# Patient Record
Sex: Male | Born: 1990 | Race: White | Hispanic: No | Marital: Single | Smoking: Current some day smoker
Health system: Southern US, Community
[De-identification: ages and names within clinical notes are randomized; demographics above are authoritative.]

## PROBLEM LIST (undated history)

## (undated) DIAGNOSIS — S83519A Sprain of anterior cruciate ligament of unspecified knee, initial encounter: Secondary | ICD-10-CM

## (undated) DIAGNOSIS — F909 Attention-deficit hyperactivity disorder, unspecified type: Secondary | ICD-10-CM

---

## 2018-10-23 ENCOUNTER — Emergency Department (HOSPITAL_COMMUNITY)
Admission: EM | Admit: 2018-10-23 | Discharge: 2018-10-24 | Disposition: A | Discharge status: Home / Self Care | Payer: BC Managed Care – PPO | Attending: Emergency Medicine | Admitting: Emergency Medicine

## 2018-10-23 ENCOUNTER — Encounter (HOSPITAL_COMMUNITY): Payer: Self-pay | Admitting: Emergency Medicine

## 2018-10-23 ENCOUNTER — Other Ambulatory Visit: Payer: Self-pay

## 2018-10-23 DIAGNOSIS — L03115 Cellulitis of right lower limb: Secondary | ICD-10-CM | POA: Diagnosis not present

## 2018-10-23 DIAGNOSIS — F1210 Cannabis abuse, uncomplicated: Secondary | ICD-10-CM | POA: Insufficient documentation

## 2018-10-23 DIAGNOSIS — F17200 Nicotine dependence, unspecified, uncomplicated: Secondary | ICD-10-CM | POA: Diagnosis not present

## 2018-10-23 DIAGNOSIS — L0291 Cutaneous abscess, unspecified: Secondary | ICD-10-CM | POA: Diagnosis present

## 2018-10-23 DIAGNOSIS — L02611 Cutaneous abscess of right foot: Secondary | ICD-10-CM | POA: Diagnosis not present

## 2018-10-23 DIAGNOSIS — R2241 Localized swelling, mass and lump, right lower limb: Secondary | ICD-10-CM | POA: Insufficient documentation

## 2018-10-23 NOTE — ED Notes (Signed)
Bed: WA06 Expected date:  Expected time:  Means of arrival:  Comments: 

## 2018-10-23 NOTE — ED Triage Notes (Signed)
Patient states he woken up by a spider bite in his bedroom. Patient has an abscess on the right inner foot. Patient has redness and swelling.

## 2018-10-24 LAB — CBC WITH DIFFERENTIAL/PLATELET
Abs Immature Granulocytes: 0 10*3/uL (ref 0.00–0.07)
Basophils Absolute: 0 10*3/uL (ref 0.0–0.1)
Basophils Relative: 1 %
Eosinophils Absolute: 0.1 10*3/uL (ref 0.0–0.5)
Eosinophils Relative: 1 %
HCT: 39 % (ref 39.0–52.0)
Hemoglobin: 12.6 g/dL — ABNORMAL LOW (ref 13.0–17.0)
Immature Granulocytes: 0 %
Lymphocytes Relative: 26 %
Lymphs Abs: 1.4 10*3/uL (ref 0.7–4.0)
MCH: 27.4 pg (ref 26.0–34.0)
MCHC: 32.3 g/dL (ref 30.0–36.0)
MCV: 84.8 fL (ref 80.0–100.0)
Monocytes Absolute: 0.5 10*3/uL (ref 0.1–1.0)
Monocytes Relative: 10 %
Neutro Abs: 3.2 10*3/uL (ref 1.7–7.7)
Neutrophils Relative %: 62 %
Platelets: 246 10*3/uL (ref 150–400)
RBC: 4.6 MIL/uL (ref 4.22–5.81)
RDW: 13.1 % (ref 11.5–15.5)
WBC: 5.2 10*3/uL (ref 4.0–10.5)
nRBC: 0 % (ref 0.0–0.2)

## 2018-10-24 LAB — BASIC METABOLIC PANEL
Anion gap: 13 (ref 5–15)
BUN: 9 mg/dL (ref 6–20)
CO2: 22 mmol/L (ref 22–32)
Calcium: 9.2 mg/dL (ref 8.9–10.3)
Chloride: 106 mmol/L (ref 98–111)
Creatinine, Ser: 0.81 mg/dL (ref 0.61–1.24)
GFR calc Af Amer: >60 mL/min (ref >60)
GFR calc non Af Amer: >60 mL/min (ref >60)
Glucose, Bld: 101 mg/dL — ABNORMAL HIGH (ref 70–99)
Potassium: 3.9 mmol/L (ref 3.5–5.1)
Sodium: 141 mmol/L (ref 135–145)

## 2018-10-24 MED ORDER — CLINDAMYCIN HCL 150 MG PO CAPS: 300.0000 mg | ORAL_CAPSULE | Freq: Four times a day (QID) | ORAL | 0 refills | Status: DC

## 2018-10-24 MED ORDER — CLINDAMYCIN HCL 300 MG PO CAPS
300.0000 mg | ORAL_CAPSULE | Freq: Once | ORAL | Status: AC
  Administered 2018-10-24: 300 mg via ORAL
  Filled 2018-10-24: qty 1

## 2018-10-24 MED ORDER — LIDOCAINE-EPINEPHRINE (PF) 2 %-1:200000 IJ SOLN
10.0000 mL | Freq: Once | INTRAMUSCULAR | Status: AC
  Administered 2018-10-24: 10 mL
  Filled 2018-10-24: qty 10

## 2018-10-24 NOTE — Discharge Instructions (Addendum)
Please read and follow all provided instructions.  Your diagnoses today include:  1. Abscess of right foot   2. Cellulitis of right lower leg     Tests performed today include:  Vital signs. See below for your results today.   Medications prescribed:   Clindamycin - antibiotic  You have been prescribed an antibiotic medicine: take the entire course of medicine even if you are feeling better. Stopping early can cause the antibiotic not to work.  Take any prescribed medications only as directed.   Home care instructions:   Follow any educational materials contained in this packet  Follow-up instructions: Return to the Emergency Department in 48 hours for a recheck if you cannot see a primary care physician.   Return instructions:  Return to the Emergency Department if you have:  Fever  Worsening symptoms  Worsening pain  Worsening swelling  Redness of the skin that moves away from the affected area, especially if it streaks away from the affected area   Any other emergent concerns  Your vital signs today were: BP 121/77 (BP Location: Left Arm)    Pulse (!) 110    Temp 98.8 F (37.1 C) (Oral)    Resp 20    Ht 5\' 11"  (1.803 m)    Wt 70.3 kg    SpO2 98%    BMI 21.62 kg/m  If your blood pressure (BP) was elevated above 135/85 this visit, please have this repeated by your doctor within one month. --------------

## 2018-10-24 NOTE — ED Provider Notes (Addendum)
[facility]-EMERGENCY DEPT Provider Note   CSN: [id number] Arrival date & time: 10/23/18  2235     History   Chief Complaint Chief Complaint  Patient presents with  . Abscess    HPI Douglas Hawkins is a 28 y.o. male.     Patient presents to the emergency department with complaint of right foot abscess, redness, warmth and swelling starting acutely 2 to 3 days ago.  Patient states that he woke up the other morning with a spider on his leg.  He was able to kill it.  He then developed a small area of swelling and pain on the inside of the foot.  This has progressed.  Yesterday he drained the area of swelling at home.  Today the redness spread up towards his knee.  No reported fevers, nausea or vomiting.  No history of immunocompromise.  Patient has not had similar skin infections in the past.     History reviewed. No pertinent past medical history.  There are no active problems to display for this patient.   History reviewed. No pertinent surgical history.      Home Medications    Prior to Admission medications   Not on File    Family History History reviewed. No pertinent family history.  Social History Social History   Tobacco Use  . Smoking status: Current Every Day Smoker  . Smokeless tobacco: Never Used  Substance Use Topics  . Alcohol use: Yes  . Drug use: Yes    Types: Marijuana     Allergies   Patient has no known allergies.   Review of Systems Review of Systems  Constitutional: Negative for fever.  Gastrointestinal: Negative for nausea and vomiting.  Musculoskeletal: Positive for myalgias.  Skin: Positive for color change and wound.       Positive for abscess  Hematological: Negative for adenopathy.     Physical Exam Updated Vital Signs BP 121/77 (BP Location: Left Arm)   Pulse (!) 110   Temp 98.8 F (37.1 C) (Oral)   Resp 20   Ht 5\' 11"  (1.803 m)   Wt 70.3 kg   SpO2 98%   BMI 21.62 kg/m    Physical Exam Vitals signs and nursing note reviewed.  Constitutional:      Appearance: He is well-developed.  HENT:     Head: Normocephalic and atraumatic.  Eyes:     Conjunctiva/sclera: Conjunctivae normal.  Neck:     Musculoskeletal: Normal range of motion and neck supple.  Cardiovascular:     Pulses:          Dorsalis pedis pulses are 2+ on the right side.       Posterior tibial pulses are 2+ on the right side.  Pulmonary:     Effort: No respiratory distress.  Musculoskeletal:       Feet:  Skin:    General: Skin is warm and dry.  Neurological:     Mental Status: He is alert.      ED Treatments / Results  Labs (all labs ordered are listed, but only abnormal results are displayed) Labs Reviewed  CBC WITH DIFFERENTIAL/PLATELET - Abnormal; Notable for the following components:      Result Value   Hemoglobin 12.6 (*)    All other components within normal limits  BASIC METABOLIC PANEL - Abnormal; Notable for the following components:   Glucose, Bld 101 (*)    All other components within normal limits    EKG  Radiology No results found.  Procedures .Marland KitchenIncision and Drainage  Date/Time: 10/24/2018 12:52 AM Performed by: Carlisle Cater, PA-C Authorized by: Carlisle Cater, PA-C   Consent:    Consent obtained:  Verbal   Consent given by:  Patient   Risks discussed:  Bleeding, incomplete drainage, pain and infection   Alternatives discussed:  No treatment Location:    Type:  Abscess   Size:  3cm   Location:  Lower extremity   Lower extremity location:  Ankle   Ankle location:  R ankle Pre-procedure details:    Skin preparation:  Betadine Anesthesia (see MAR for exact dosages):    Anesthesia method:  Local infiltration   Local anesthetic:  Lidocaine 2% WITH epi Procedure type:    Complexity:  Simple Procedure details:    Needle aspiration: no     Incision types:  Stab incision   Scalpel blade:  11   Wound management:  Probed and deloculated    Drainage:  Purulent   Drainage amount:  Scant   Wound treatment:  Wound left open   Packing materials:  None Post-procedure details:    Patient tolerance of procedure:  Tolerated well, no immediate complications   (including critical care time)  Medications Ordered in ED Medications  clindamycin (CLEOCIN) capsule 300 mg (300 mg Oral Given 10/24/18 0027)  lidocaine-EPINEPHrine (XYLOCAINE W/EPI) 2 %-1:200000 (PF) injection 10 mL (10 mLs Infiltration Given by Other 10/24/18 0031)     Initial Impression / Assessment and Plan / ED Course  I have reviewed the triage vital signs and the nursing notes.  Pertinent labs & imaging results that were available during my care of the patient were reviewed by me and considered in my medical decision making (see chart for details).        Patient seen and examined.  Will proceed with incision and drainage to fully open up abscess pocket.  Patient will be started on antibiotics.  Offered IV antibiotics and patient declines.  Will give 300 mg p.o. clindamycin.  Will check basic lab work as well given extent of cellulitis.  Patient appears well, nontoxic.  Afebrile on arrival.  Mild associated tenderness, no severe tenderness as would be expected for necrotizing fasciitis.  Mild tachycardia, will recheck.  No history of diabetes, other comorbidities, or immunocompromise.  Suspect patient will be able to be discharged with close follow-up, return if worsening.   Vital signs reviewed and are as follows: BP 121/77 (BP Location: Left Arm)   Pulse (!) 110   Temp 98.8 F (37.1 C) (Oral)   Resp 20   Ht 5\' 11"  (1.803 m)   Wt 70.3 kg   SpO2 98%   BMI 21.62 kg/m   12:51 AM I&D performed as above. WBC is normal. Pending BMP and vitals recheck.   The patient was urged to return to the Emergency Department urgently with worsening pain, swelling, expanding erythema especially if it streaks away from the affected area, fever, or if they have any other concerns.    The patient verbalized understanding and stated agreement with this plan.   1:08 AM Pt updated. Tachycardia improved.   BP 106/64 (BP Location: Right Arm)   Pulse 90   Temp 98.8 F (37.1 C) (Oral)   Resp 18   Ht 5\' 11"  (1.803 m)   Wt 70.3 kg   SpO2 100%   BMI 21.62 kg/m     Final Clinical Impressions(s) / ED Diagnoses   Final diagnoses:  Abscess of right foot  Cellulitis of right lower leg   Patient, well-appearing, no comorbidities or immunocompromise, normal white blood cell count.  No pain on proportion as would be expected for necrotizing fasciitis.  Patient with extensive cellulitis but appears clinically well.  I do not feel he requires hospitalization at this time.  We discussed strict return instructions as above.  Discussed need for recheck in 24 to 48 hours.  Patient seems reliable to return with worsening or changing symptoms.  ED Discharge Orders         Ordered    clindamycin (CLEOCIN) 150 MG capsule  Every 6 hours     10/24/18 0051              Renne CriglerGeiple, Adnan Vanvoorhis, PA-C 10/24/18 0108    Zadie RhineWickline, Donald, MD 10/24/18 [phone]

## 2019-05-24 ENCOUNTER — Ambulatory Visit (INDEPENDENT_AMBULATORY_CARE_PROVIDER_SITE_OTHER): Payer: BC Managed Care – PPO | Admitting: Psychiatry

## 2019-05-24 ENCOUNTER — Encounter: Payer: Self-pay | Admitting: Psychiatry

## 2019-05-24 VITALS — Ht 71.0 in | Wt 195.0 lb

## 2019-05-24 DIAGNOSIS — F902 Attention-deficit hyperactivity disorder, combined type: Secondary | ICD-10-CM

## 2019-05-24 DIAGNOSIS — F4010 Social phobia, unspecified: Secondary | ICD-10-CM | POA: Diagnosis not present

## 2019-05-24 DIAGNOSIS — F331 Major depressive disorder, recurrent, moderate: Secondary | ICD-10-CM | POA: Diagnosis not present

## 2019-05-24 MED ORDER — AMPHETAMINE-DEXTROAMPHETAMINE 10 MG PO TABS: 10.0000 mg | ORAL_TABLET | Freq: Every day | ORAL | 0 refills | Status: DC

## 2019-05-24 MED ORDER — AMPHETAMINE-DEXTROAMPHET ER 25 MG PO CP24: 25.0000 mg | ORAL_CAPSULE | Freq: Two times a day (BID) | ORAL | 0 refills | Status: DC

## 2019-05-24 MED ORDER — CLONAZEPAM 0.5 MG PO TABS: 0.5000 mg | ORAL_TABLET | Freq: Two times a day (BID) | ORAL | 0 refills | Status: DC | PRN

## 2019-05-24 MED ORDER — SERTRALINE HCL 100 MG PO TABS: ORAL_TABLET | ORAL | 1 refills | Status: DC

## 2019-05-24 NOTE — Progress Notes (Signed)
Virtual Visit via Video Note  I connected with Douglas Hawkins on 05/24/19 at 10:30 AM EST by a video enabled telemedicine application and verified that I am speaking with the correct person using two identifiers.  Location: Patient: Home Provider: Home   I discussed the limitations of evaluation and management by telemedicine and the availability of in person appointments. The patient expressed understanding and agreed to proceed.  I discussed the assessment and treatment plan with the patient. The patient was provided an opportunity to ask questions and all were answered. The patient agreed with the plan and demonstrated an understanding of the instructions.   The patient was advised to call back or seek an in-person evaluation if the symptoms worsen or if the condition fails to improve as anticipated.  I provided 70 minutes of non-face-to-face time during this encounter.   Corie Chiquito, PMHNP   [facility] MD/PA/NP Initial Note  05/24/2019 5:44 PM Douglas Hawkins  MRN:  [id number]  Chief Complaint:  Chief Complaint    Establish Care; Anxiety; Depression; ADD      HPI: Patient is a 29 year old male being seen for initial evaluation to establish care for management of anxiety, depression, and attention deficit after moving from [place] in 2019.  Patient reports that he was continuing to travel to [place] every 3 months to see his psychiatrist there until Dr. Clelia Croft until had to close his practice.  Patient reports that this coincided with the start of the pandemic, which contributed to some delays with him establishing care with a new provider.   He reports that he had been stable on Sertraline 150 mg po qd, Adderall XR 25 mg po BID, Adderall 10 mg po BID, and Klonopin 0.5 mg po BID to TID prn anxiety.  He reports that he was not able to continue medications due to not having a psychiatric provider and that PCP restarted sertraline at 50 mg about 2 weeks ago.  He  first sought psychiatric tx around age 67 or 89 in 2011-2012. He reports that he was dx'd with ADHD, depression, and social anxiety at that time. He first noticed some social anxiety in elementary school. He reports that he has difficulty starting conversations and anxiety in social situations. Reports feeling as if others are scrutinizing him at times when he know that this is not accurate. Will have increased HR and BP and feel entrapped. Will feel "like I am seizing up" with muscle tightness. He reports that he has had a couple of panic attacks, but this is not typical for him and has only occurred in the context of severe stressors. He denies generalized anxiety. He reports feeling self-conscious. Occ anxiety with driving. Occ checking behaviors. He reports at times wanting to make sure he is not seen.  He reports that his mood has been better with idea of re-starting medications. He reports some depression in response to moving right before the pandemic and not being able to establish a friend's group, and therefore feeling more isolated. He reports, "I haven't been alarmed by mental health," and that he has had more severe signs and symptoms in the past.  He reports that he has been sleeping ok and sleeps about 7-8 hours a night. Appetite has been good. Has gained a few pounds with covid. He reports that energy and motivation have been low. He reports that this has interfered some with personal hygiene and "seemingly arbitrary things." Denies anhedonia. Denies any current or past SI.    He  reports that in the past depressive episodes he has severe negative thinking and has some apathy and occ oversleeping. He reports that he becomes disinterested in learning new things.  He reports that he typically has some mild depressive s/s that periodically flare.   Denies any past manic s/s.   Denies AH or VH. Denies paranoia.  Has been dx'd with ADHD. He reports difficulty prioritizing tasks and completing  tasks. He reports that he tends to procrastinating things. Frequently loses and misplaces things. Easily distracted. Difficulty siting still and will get antsy.    Born in [place] and lived there until age 4-4. Moved frequently in childhood.Has an older sister that is 19 and is a Garment/textile technologist. He reports that he had a good childhood and parents are supportive.  Lived in [place] and moved to [place] in 2013. Has had some community college. Had a job interview yesterday and plans to work in [employer]. Moved to [place] in 2019. Lives with his parents. Father wanted to downsize on their home and mother is from [place] and they have family in the area. Parents and sister are all supportive. He enjoys reading, music, video games, and target/ skeet shooting. Describes himself as introverted. Currently not in a relationship.  Past Psychiatric Medication Trials: Sertraline- re-started 2 weeks ago at 50 mg po qd by PCP. Took in HS. He reports that he has taken up to 200 mg po qd in the past. He was taking around 150 mg prior to stopping mediations. Denies side effects other than possible affective dulling. Prozac Effexor Buspar- Took briefly and had side effects. Ritalin- "felt like a robot." Adderall XR- Took 25 mg  Adderall- Has taken prn  Vyvanse- Had insomnia and was not able to wind down at the end of the day. Klonopin- Took 0.5 mg po TID prn anxiety   Visit Diagnosis:    ICD-10-CM   1. Attention deficit hyperactivity disorder (ADHD), combined type  F90.2 amphetamine-dextroamphetamine (ADDERALL XR) 25 MG 24 hr capsule    amphetamine-dextroamphetamine (ADDERALL) 10 MG tablet  2. Social anxiety disorder  F40.10 sertraline (ZOLOFT) 100 MG tablet    clonazePAM (KLONOPIN) 0.5 MG tablet  3. Moderate episode of recurrent major depressive disorder (HCC)  F33.1 sertraline (ZOLOFT) 100 MG tablet    Past Psychiatric History: h/o psychiatric hospitalization in 2014 or 2015. Was seeing Dr. Clelia Croft in  [place]. Saw a therapist in [place] in his teens.   Past Medical History: No past medical history on file. No past surgical history on file.  Family Psychiatric History: Suspects that his mother has undiagnosed ADD. Mother's cousin committed suicide and may have had Bipolar D/O.   Family History:  Family History  Problem Relation Age of Onset  . ADD / ADHD Mother   . Hypertension Father   . Diabetes Father   . Diabetes Paternal Grandfather   . Suicidality Cousin   . Bipolar disorder Cousin     Social History:  Social History   Socioeconomic History  . Marital status: Single    Spouse name: Not on file  . Number of children: Not on file  . Years of education: Not on file  . Highest education level: Not on file  Occupational History  . Not on file  Tobacco Use  . Smoking status: Current Some Day Smoker  . Smokeless tobacco: Never Used  Substance and Sexual Activity  . Alcohol use: Yes    Comment: 2-3 drinks daily  . Drug use: Not Currently  .  Sexual activity: Not on file  Other Topics Concern  . Not on file  Social History Narrative  . Not on file   Social Determinants of Health   Financial Resource Strain:   . Difficulty of Paying Living Expenses: Not on file  Food Insecurity:   . Worried About Programme researcher, broadcasting/film/video in the Last Year: Not on file  . Ran Out of Food in the Last Year: Not on file  Transportation Needs:   . Lack of Transportation (Medical): Not on file  . Lack of Transportation (Non-Medical): Not on file  Physical Activity:   . Days of Exercise per Week: Not on file  . Minutes of Exercise per Session: Not on file  Stress:   . Feeling of Stress : Not on file  Social Connections:   . Frequency of Communication with Friends and Family: Not on file  . Frequency of Social Gatherings with Friends and Family: Not on file  . Attends Religious Services: Not on file  . Active Member of Clubs or Organizations: Not on file  . Attends Banker  Meetings: Not on file  . Marital Status: Not on file    Allergies: No Known Allergies  Metabolic Disorder Labs: No results found for: HGBA1C, MPG No results found for: PROLACTIN No results found for: CHOL, TRIG, HDL, CHOLHDL, VLDL, LDLCALC No results found for: TSH  Therapeutic Level Labs: No results found for: LITHIUM No results found for: VALPROATE No components found for:  CBMZ  Current Medications: Current Outpatient Medications  Medication Sig Dispense Refill  . Multiple Vitamin (MULTIVITAMIN) capsule Take 1 capsule by mouth daily.    . sertraline (ZOLOFT) 100 MG tablet Take 1 tab po qd for 1 week, then increase to 1.5 tabs po qd 45 tablet 1  . amphetamine-dextroamphetamine (ADDERALL XR) 25 MG 24 hr capsule Take 1 capsule by mouth 2 (two) times daily. 60 capsule 0  . amphetamine-dextroamphetamine (ADDERALL) 10 MG tablet Take 1 tablet (10 mg total) by mouth daily with breakfast. 30 tablet 0  . clonazePAM (KLONOPIN) 0.5 MG tablet Take 1 tablet (0.5 mg total) by mouth 2 (two) times daily as needed for anxiety. 60 tablet 0   No current facility-administered medications for this visit.    Medication Side Effects: none  Orders placed this visit:  No orders of the defined types were placed in this encounter.   Psychiatric Specialty Exam:  Review of Systems  Constitutional: Negative.   HENT: Negative.   Eyes: Negative.   Respiratory: Negative.   Endocrine: Negative.   Genitourinary: Negative.   Allergic/Immunologic: Negative.   Neurological: Negative.   Psychiatric/Behavioral:       Please refer to HPI    Height 5\' 11"  (1.803 m), weight 195 lb (88.5 kg).Body mass index is 27.2 kg/m.  General Appearance: Casual and Neat  Eye Contact:  Good  Speech:  Clear and Coherent and Normal Rate  Volume:  Normal  Mood:  Anxious and Depressed  Affect:  Congruent and Depressed  Thought Process:  Coherent, Linear and Descriptions of Associations: Intact  Orientation:  Full  (Time, Place, and Person)  Thought Content: Logical and Hallucinations: None   Suicidal Thoughts:  No  Homicidal Thoughts:  No  Memory:  WNL  Judgement:  Intact  Insight:  Good  Psychomotor Activity:  Normal  Concentration:  Concentration: Good and Attention Span: Good  Recall:  Good  Fund of Knowledge: Good  Language: Good  Assets:  Communication Skills Desire for  Improvement Resilience Social Support  ADL's:  Intact  Cognition: WNL  Prognosis:  Good   Receiving Psychotherapy: No   Treatment Plan/Recommendations: Patient seen for 70 minutes and at counseling patient and coordination of care, to include process to obtain records from previous psychiatric provider.  Patient reports that he will look for contact information and forms given to him by previous psychiatric provider.  Discussed resuming previous treatment regimen since patient reports that this was helpful in the past and well-tolerated.  Discussed continuing to increase sertraline to 150 mg daily since patient was previously on 150 to 200 mg daily, and these are the doses typically required for adequate control of social anxiety signs and symptoms.  Will increase sertraline to 100 mg daily for 1 week, then increase to 150 mg daily for anxiety and depression.  Will restart Adderall XR 25 mg twice daily for attention deficit disorder.  Discussed that Adderall 10 mg once daily as needed would be prescribed for ADHD.  Will start Klonopin 0.5 mg twice daily as needed for anxiety.  Patient reports that he is amenable to starting psychotherapy.  Patient referred to Sammuel Cooper, LCSW.  Follow-up with this provider in 4 weeks or sooner if clinically indicated. Patient advised to contact office with any questions, adverse effects, or acute worsening in signs and symptoms.    Thayer Headings, PMHNP

## 2019-05-30 ENCOUNTER — Ambulatory Visit: Payer: BC Managed Care – PPO | Admitting: Addiction (Substance Use Disorder)

## 2019-05-31 ENCOUNTER — Telehealth: Payer: Self-pay | Admitting: Psychiatry

## 2019-05-31 ENCOUNTER — Ambulatory Visit: Payer: BC Managed Care – PPO | Attending: Internal Medicine

## 2019-05-31 DIAGNOSIS — Z20822 Contact with and (suspected) exposure to covid-19: Secondary | ICD-10-CM

## 2019-06-01 ENCOUNTER — Telehealth: Payer: Self-pay

## 2019-06-01 LAB — NOVEL CORONAVIRUS, NAA: SARS-CoV-2, NAA: NOT DETECTED

## 2019-06-01 NOTE — Telephone Encounter (Signed)
Prior authorization submitted for Amphetamine-Dextroamphetamine XR 25 mg 1 bid, and Amphetamine-Dextroamphetamine 10 mg 1 daily.  Both approved through [facility] [employer] [id number], [facility] effective 05/31/2019-05/30/2022   Submitted through cover my meds

## 2019-06-07 NOTE — Telephone Encounter (Signed)
Error

## 2019-06-09 ENCOUNTER — Telehealth: Payer: Self-pay | Admitting: Psychiatry

## 2019-06-09 NOTE — Telephone Encounter (Signed)
Douglas Hawkins called to report that the pharmacy, [facility] on [facility] [address], said they still can't fill the Adderall 10mg  because they said the medication approved is not the same as the medication prescribed.  One was a sulfate and the other was a salt combo so they need a PA for the medication prescribed or a prescription for the one that was approved.  Please call the pharmacy to straighten this out so he can get his medication.  He got the Adderall XR 25mg  just fine.

## 2019-06-10 NOTE — Telephone Encounter (Signed)
Called patient's pharmacy left voicemail the Adderall 10 mg (amphetamine-dextroamphetamine 10 mg) has been approved as well effective 06/09/2019-06/08/2022.

## 2019-06-21 ENCOUNTER — Ambulatory Visit: Payer: BC Managed Care – PPO | Admitting: Psychiatry

## 2019-06-24 ENCOUNTER — Ambulatory Visit: Payer: BC Managed Care – PPO | Admitting: Psychiatry

## 2021-04-04 ENCOUNTER — Other Ambulatory Visit: Payer: Self-pay | Admitting: Adult Health

## 2021-04-04 DIAGNOSIS — M25851 Other specified joint disorders, right hip: Secondary | ICD-10-CM

## 2021-04-04 DIAGNOSIS — M25852 Other specified joint disorders, left hip: Secondary | ICD-10-CM

## 2021-05-07 ENCOUNTER — Ambulatory Visit
Admission: RE | Admit: 2021-05-07 | Discharge: 2021-05-07 | Disposition: A | Discharge status: Home / Self Care | Payer: Self-pay | Source: Ambulatory Visit | Attending: Adult Health | Admitting: Adult Health

## 2021-05-07 ENCOUNTER — Other Ambulatory Visit: Payer: Self-pay

## 2021-05-07 DIAGNOSIS — M25852 Other specified joint disorders, left hip: Secondary | ICD-10-CM

## 2021-05-07 DIAGNOSIS — M25851 Other specified joint disorders, right hip: Secondary | ICD-10-CM

## 2021-05-07 IMAGING — MR MR HIP*R* W/O CM
5 series · 34 of 40 positions shown · non-contrast
Comparison: None available

CLINICAL DATA: Bilateral hip injury [6W]. Chronic bilateral hip
pain with loss of range of motion.

EXAM:
MR OF THE BILATERAL HIPS WITHOUT CONTRAST
TECHNIQUE: Multiplanar, multisequence MR imaging was performed. No intravenous
contrast was administered.

[Series 9: T2 fat-sat · coronal · right · 3.0mm · 0.89mm/px · 8 of 30 slices shown (1 of 2)]
[im 1/30]
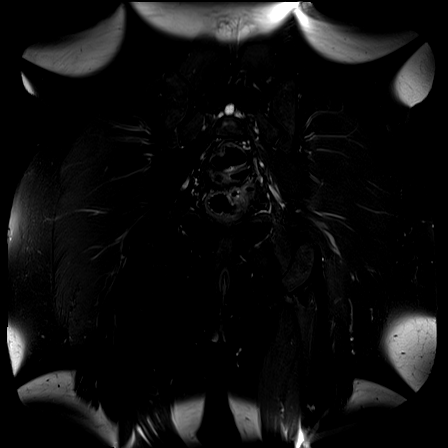
[im 5/30]
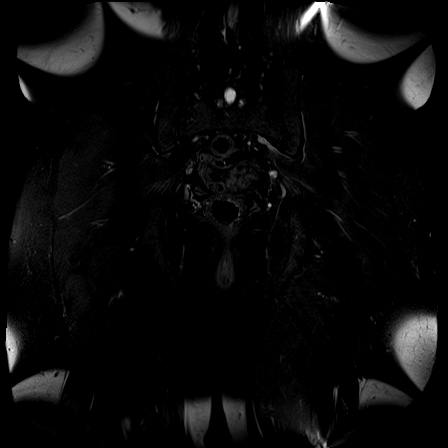
[im 9/30]
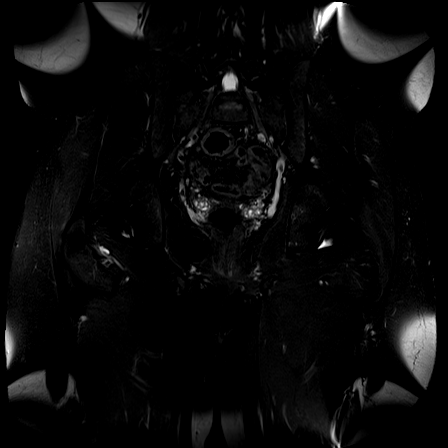
[im 13/30]
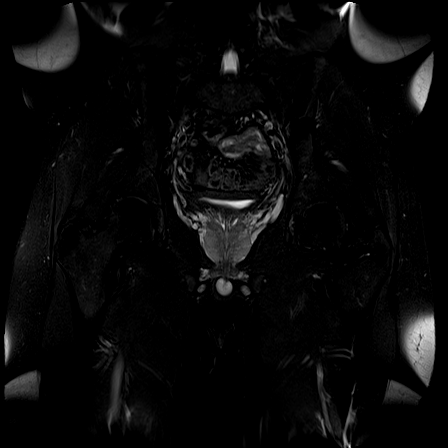
[im 17/30]
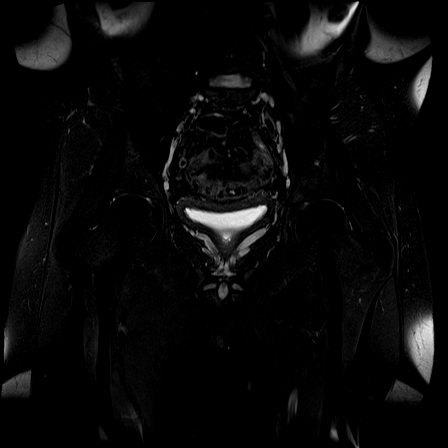
[im 21/30]
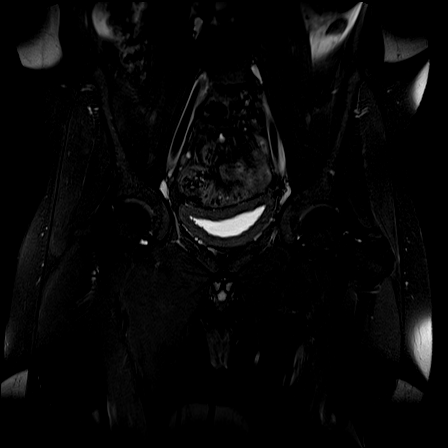
[im 25/30]
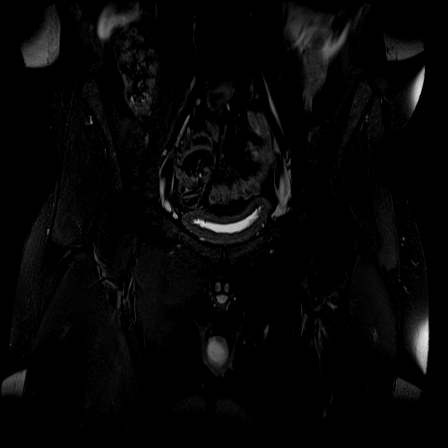
[im 30/30]
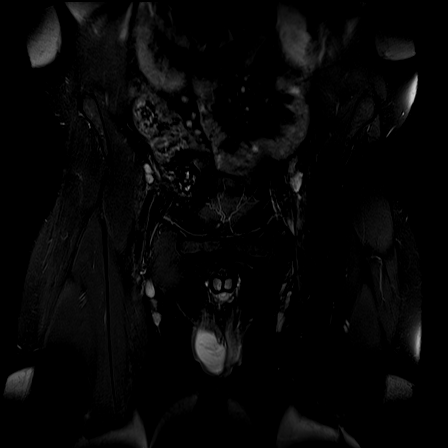

[Series 10: T1 · coronal · right · 3.0mm · 0.89mm/px · 2 of 30 slices shown]
[im 1/30]
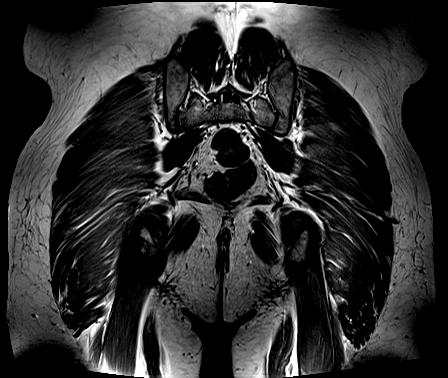
[im 5/30]
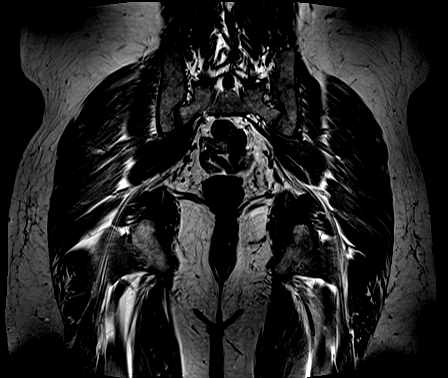

[Series 11: T2 fat-sat · axial · right · 3.0mm · 1.19mm/px · z∈[-72,+39]mm · 8 of 32 slices shown (2 of 2)]
[im 1/32]
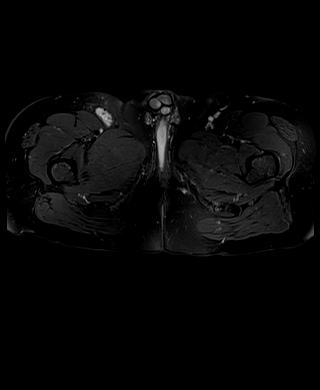
[im 5/32]
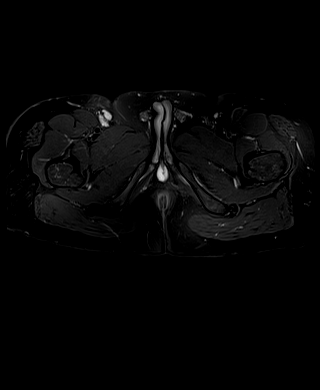
[im 9/32]
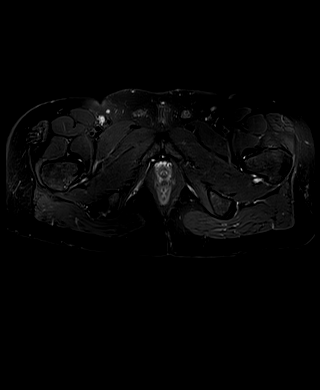
[im 14/32]
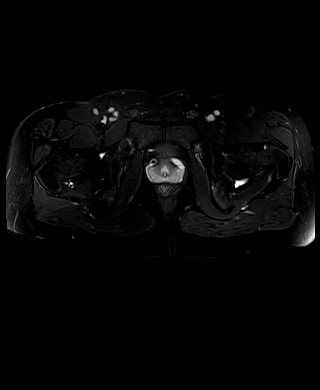
[im 18/32]
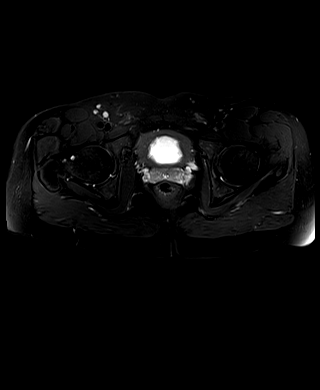
[im 23/32]
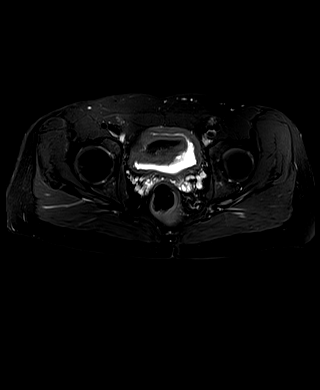
[im 27/32]
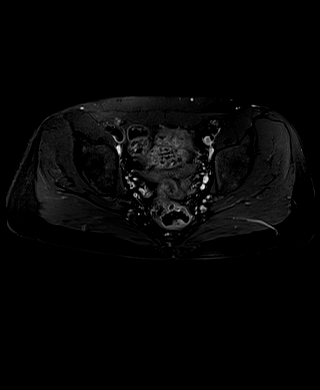
[im 32/32]
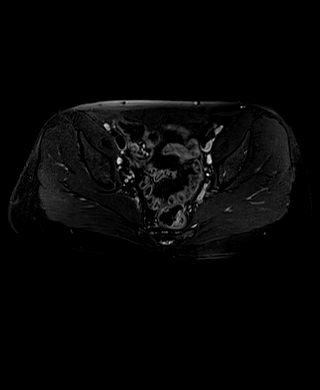

[Series 13: PD fat-sat · sagittal · right · 3.0mm · 0.56mm/px · 9 of 35 slices shown (1 of 2)]
[im 1/35]
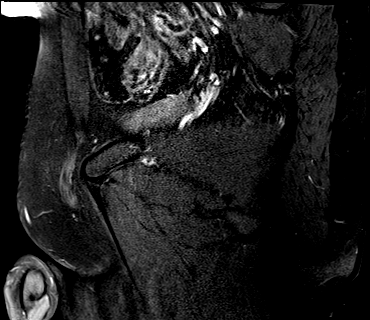
[im 5/35]
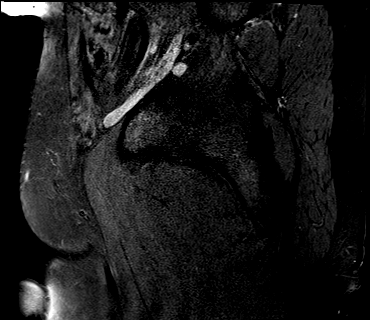
[im 9/35]
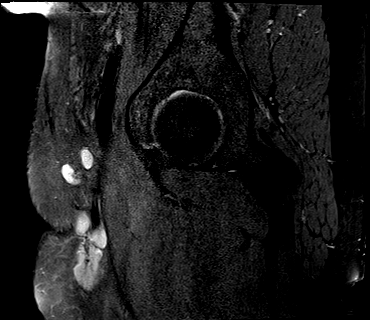
[im 13/35]
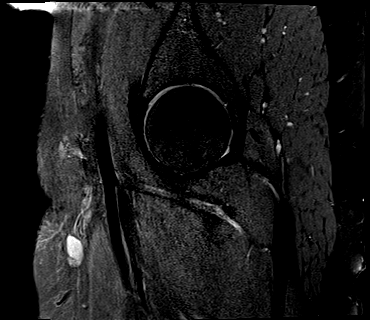
[im 18/35]
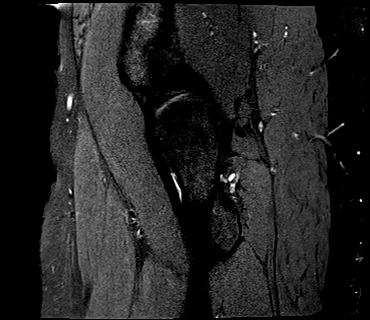
[im 22/35]
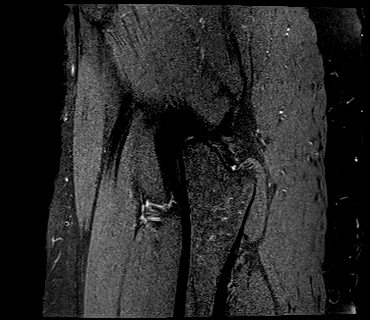
[im 26/35]
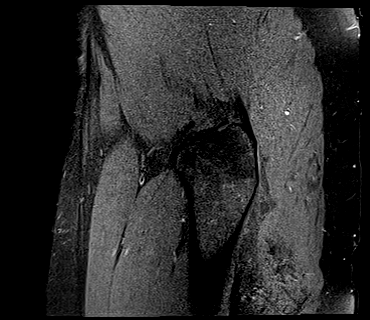
[im 30/35]
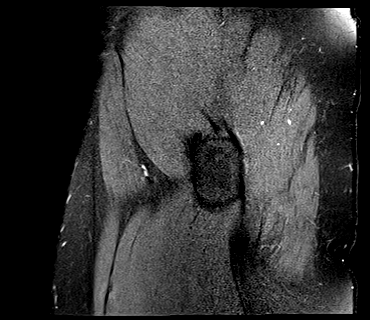
[im 35/35]
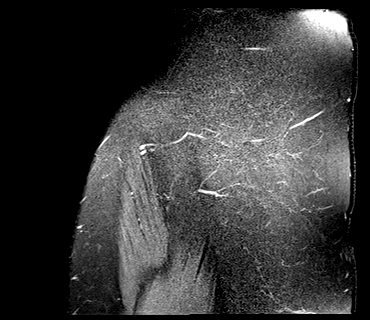

[Series 14: PD fat-sat · coronal · right · 3.0mm · 0.70mm/px · 7 of 28 slices shown (2 of 2)]
[im 1/28]
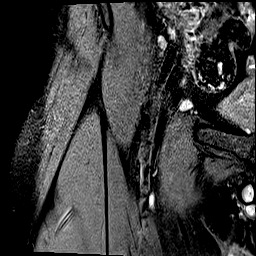
[im 5/28]
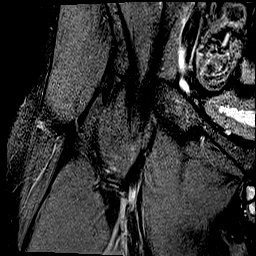
[im 10/28]
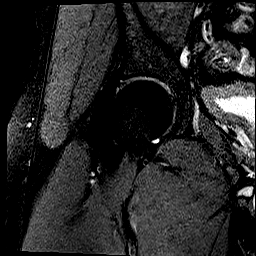
[im 14/28]
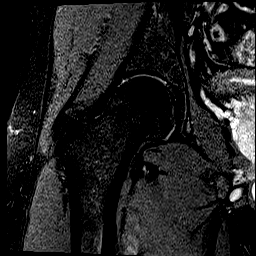
[im 19/28]
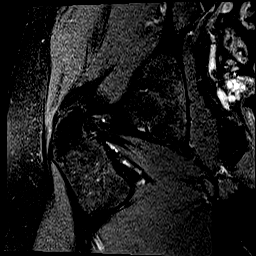
[im 23/28]
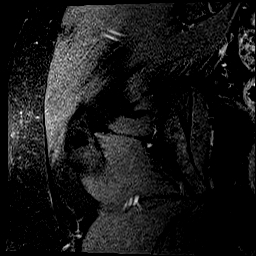
[im 28/28]
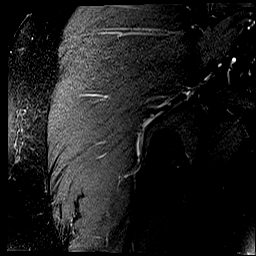

[34 of 40 positions shown; findings below may reference images not displayed]

FINDINGS: Bones: No acute fracture or avascular necrosis is seen within the
visualized portion of the pelvis or either proximal femur. Mild
pubic symphysis joint space narrowing and peripheral osteophytosis
degenerative change.

Articular cartilage and labrum

Right hip:

Articular cartilage: Intact cartilage. There is minimal convexity of
the anterior superior right femoral head-neck junction, mild CAM
type deformity. In this region there is a 6 mm subcortical cyst
suggesting the sequela of impingement.

Labrum: Lack of intra-articular fluid limits evaluation of the right
acetabular labrum. Within this limitation, there is increased fluid
sensitive linear signal within the superior right chondrolabral
junction extending from the anterior superior 11 o'clock location
through the posterosuperior 1 o'clock location. A portion of the
posterior aspect of this increased signal extends into what may be a
normal variant posterosuperior sublabral sulcus.

Left hip:

Articular cartilage: Intact cartilage. There is a minimal convexity
of the anterior superior left femoral head-neck junction, mild CAM
type bump deformity.

Labrum: Lack of intra-articular fluid limits evaluation of the left
acetabular labrum. There is linear increased proton density signal
within the superior left chondrolabral junction (left hip coronal
series 15 images 10 through 15). This is suspicious for a small
tear. A portion of the posterior aspect of this increased signal
extends into what may be a normal variant posterosuperior sublabral
sulcus.

Joint or bursal effusion

Joint effusion:  No joint effusion within either hip.

Bursae:  No trochanteric bursitis on either side.

Muscles and tendons

Muscles and tendons: The origins of the bilateral rectus femoris
tendons and the bilateral common hamstring tendon origins are
intact. The insertions of the bilateral gluteus minimus, gluteus
medius, and iliopsoas tendons are intact. The rectus
abdominis-adductor aponeuroses are intact.

Other findings

Miscellaneous: The urinary bladder is decompressed, limiting
evaluation. There does appear to be a diffuse mildly thickened
urinary bladder wall. Cannot exclude cystitis. The prostate and
seminal vesicles are grossly unremarkable.
IMPRESSION: :
IMPRESSION: 1. Mild bilateral anterior superior femoroacetabular anterosuperior
femoral head-neck junction convexities, CAM type bump deformities
that increased propensity for CAM type femoroacetabular impingement.
2. Lack of intra-articular fluid limits evaluation of the bilateral
acetabular Labra. There does appear to be linear increased fluid
signal suggesting bilateral superior acetabular chondrolabral tears,
as described above.
3. The urinary bladder is decompressed, limiting evaluation. There
does appear to be a diffuse mildly thickened urinary bladder wall.
Cannot exclude cystitis. Recommend clinical correlation. Urinalysis
may help further characterize, if clinically indicated.

## 2021-05-07 IMAGING — MR MR HIP*L* W/O CM
5 series · 33 of 40 positions shown · non-contrast
Comparison: None available

CLINICAL DATA: Bilateral hip injury [6W]. Chronic bilateral hip
pain with loss of range of motion.

EXAM:
MR OF THE BILATERAL HIPS WITHOUT CONTRAST
TECHNIQUE: Multiplanar, multisequence MR imaging was performed. No intravenous
contrast was administered.

[Series 9: T2 fat-sat · coronal · right · 3.0mm · 0.89mm/px · 8 of 30 slices shown (1 of 2)]
[im 1/30]
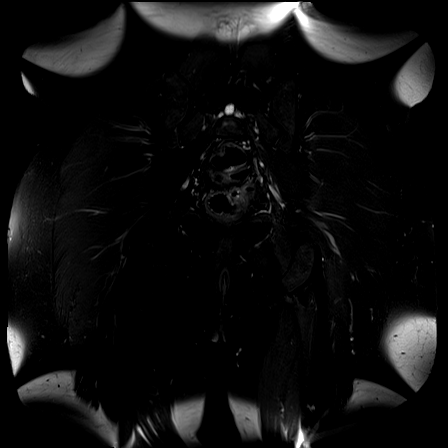
[im 5/30]
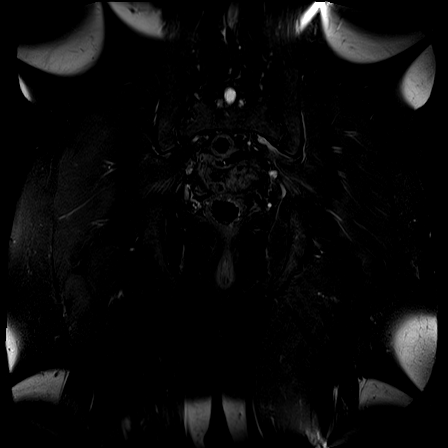
[im 9/30]
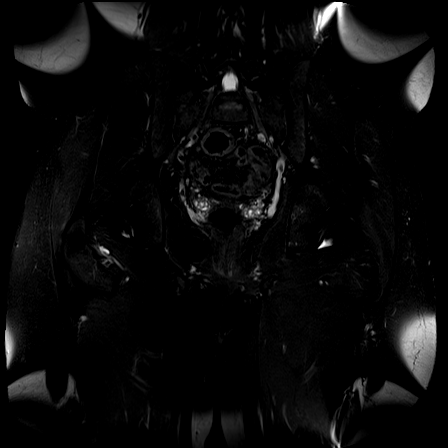
[im 13/30]
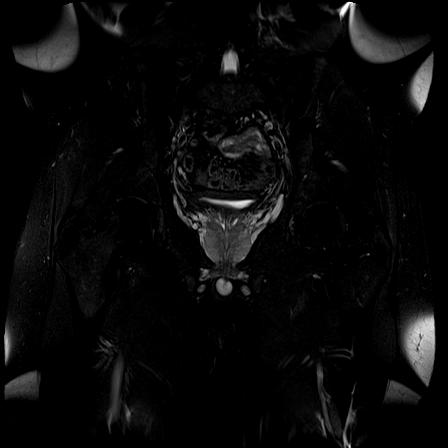
[im 17/30]
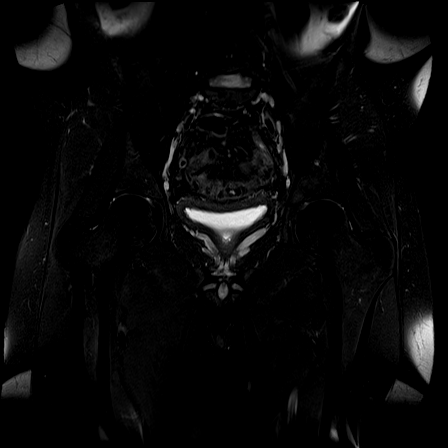
[im 21/30]
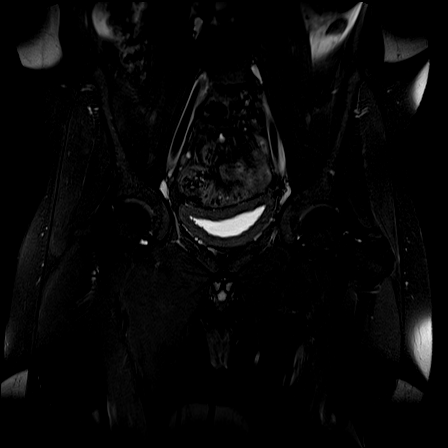
[im 25/30]
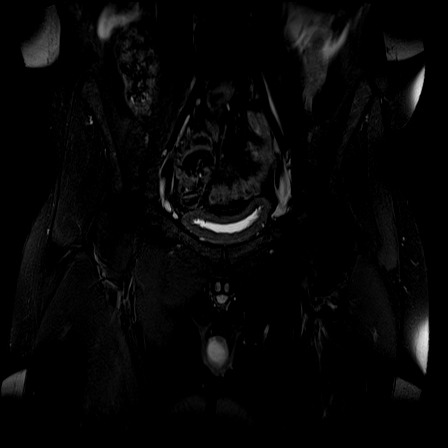
[im 30/30]
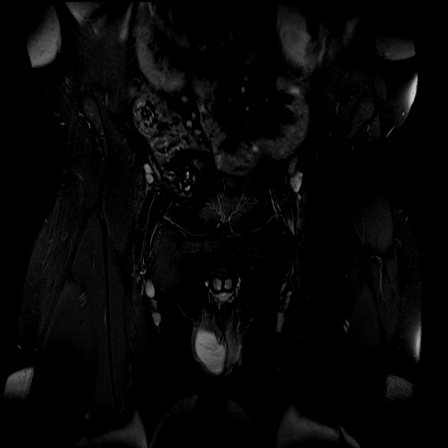

[Series 10: T1 · coronal · right · 3.0mm · 0.89mm/px · 1 of 30 slices shown]
[im 1/30]
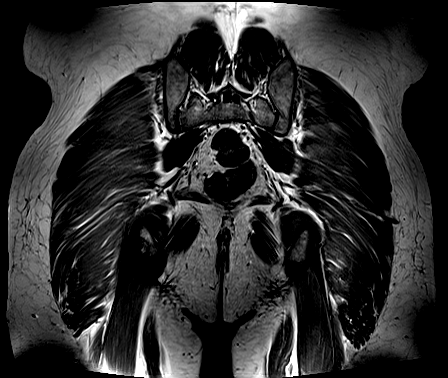

[Series 11: T2 fat-sat · axial · right · 3.0mm · 1.19mm/px · z∈[-72,+39]mm · 8 of 32 slices shown (2 of 2)]
[im 1/32]
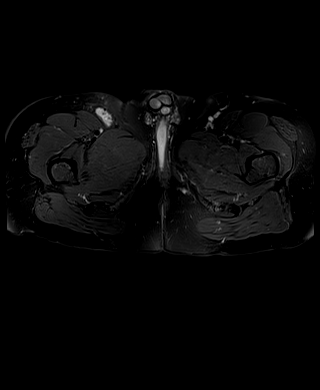
[im 5/32]
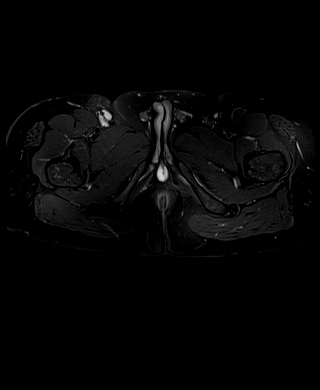
[im 9/32]
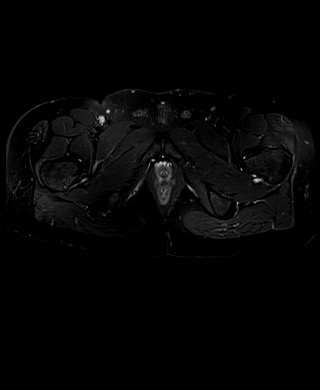
[im 14/32]
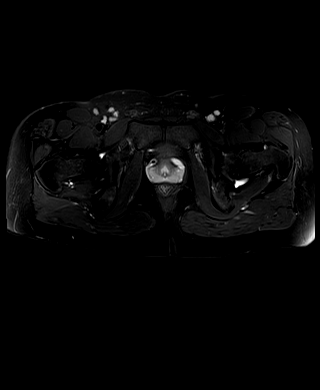
[im 18/32]
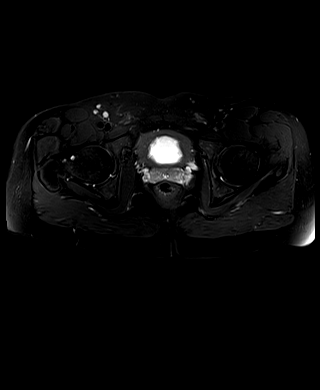
[im 23/32]
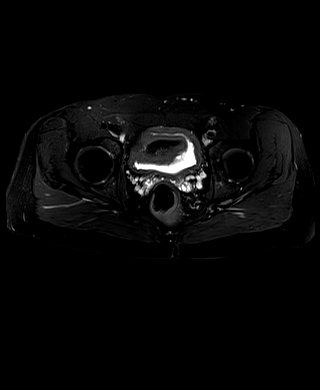
[im 27/32]
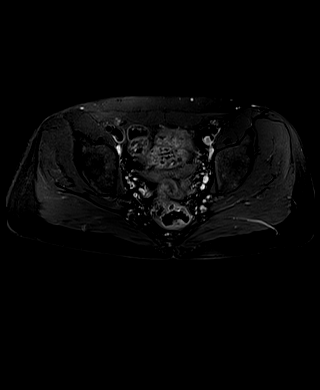
[im 32/32]
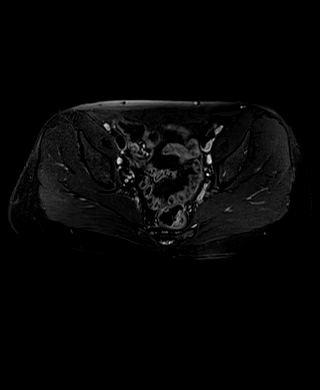

[Series 15: PD fat-sat · coronal · right · 3.0mm · 0.56mm/px · 7 of 28 slices shown (1 of 2)]
[im 1/28]
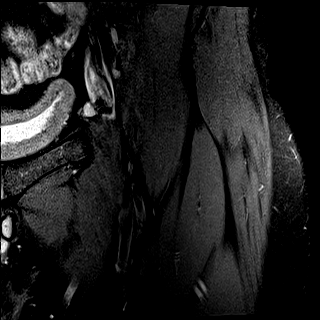
[im 5/28]
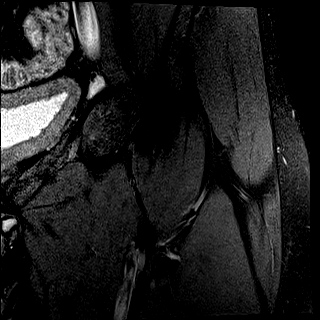
[im 10/28]
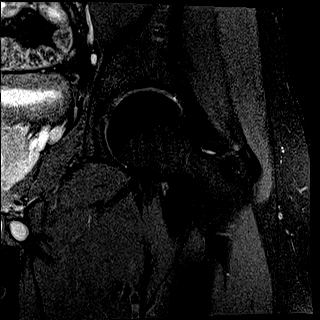
[im 14/28]
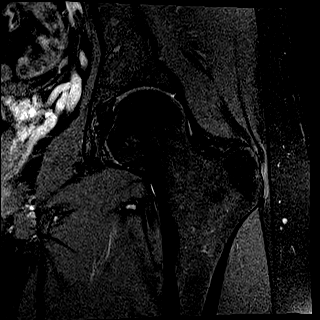
[im 19/28]
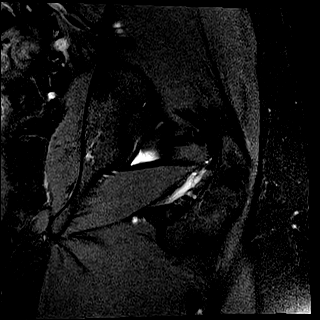
[im 23/28]
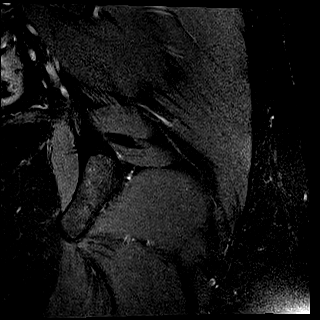
[im 28/28]
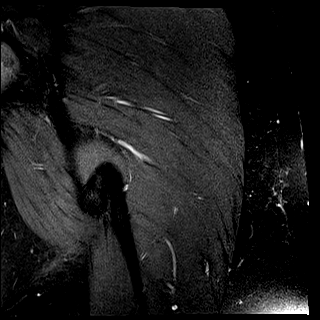

[Series 16: PD fat-sat · sagittal · right · 3.0mm · 0.56mm/px · 9 of 35 slices shown (2 of 2)]
[im 1/35]
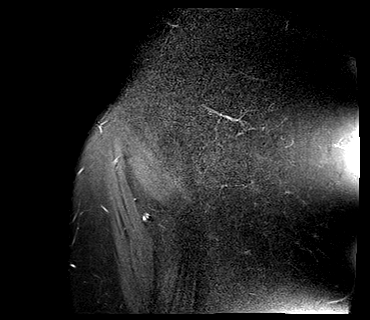
[im 5/35]
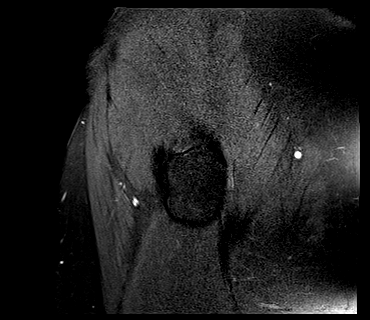
[im 9/35]
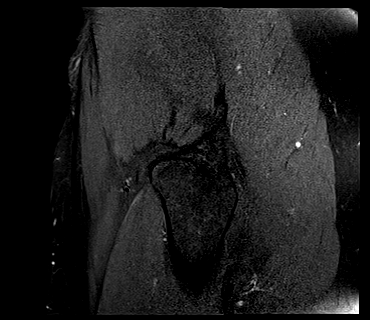
[im 13/35]
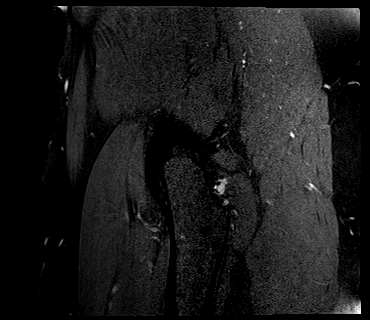
[im 18/35]
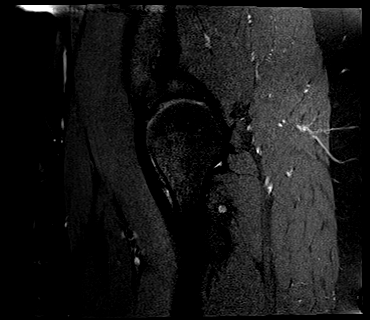
[im 22/35]
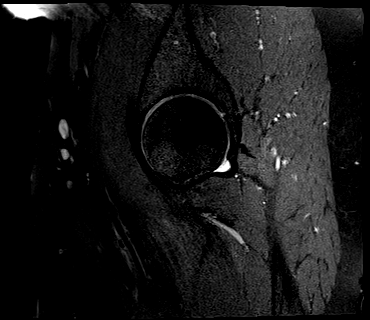
[im 26/35]
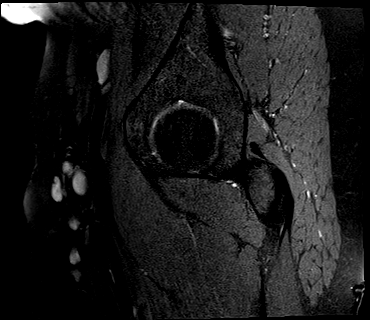
[im 30/35]
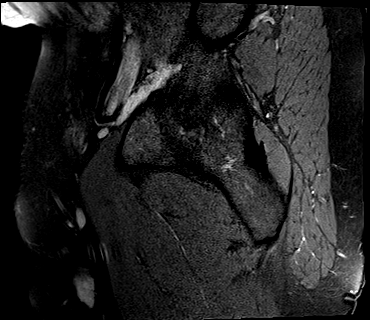
[im 35/35]
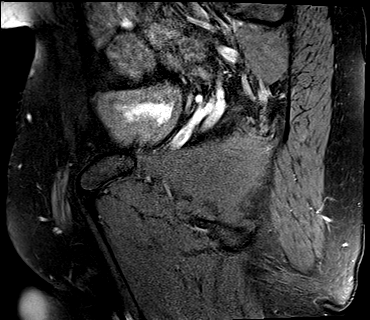

[33 of 40 positions shown; findings below may reference images not displayed]

FINDINGS: Bones: No acute fracture or avascular necrosis is seen within the
visualized portion of the pelvis or either proximal femur. Mild
pubic symphysis joint space narrowing and peripheral osteophytosis
degenerative change.

Articular cartilage and labrum

Right hip:

Articular cartilage: Intact cartilage. There is minimal convexity of
the anterior superior right femoral head-neck junction, mild CAM
type deformity. In this region there is a 6 mm subcortical cyst
suggesting the sequela of impingement.

Labrum: Lack of intra-articular fluid limits evaluation of the right
acetabular labrum. Within this limitation, there is increased fluid
sensitive linear signal within the superior right chondrolabral
junction extending from the anterior superior 11 o'clock location
through the posterosuperior 1 o'clock location. A portion of the
posterior aspect of this increased signal extends into what may be a
normal variant posterosuperior sublabral sulcus.

Left hip:

Articular cartilage: Intact cartilage. There is a minimal convexity
of the anterior superior left femoral head-neck junction, mild CAM
type bump deformity.

Labrum: Lack of intra-articular fluid limits evaluation of the left
acetabular labrum. There is linear increased proton density signal
within the superior left chondrolabral junction (left hip coronal
series 15 images 10 through 15). This is suspicious for a small
tear. A portion of the posterior aspect of this increased signal
extends into what may be a normal variant posterosuperior sublabral
sulcus.

Joint or bursal effusion

Joint effusion:  No joint effusion within either hip.

Bursae:  No trochanteric bursitis on either side.

Muscles and tendons

Muscles and tendons: The origins of the bilateral rectus femoris
tendons and the bilateral common hamstring tendon origins are
intact. The insertions of the bilateral gluteus minimus, gluteus
medius, and iliopsoas tendons are intact. The rectus
abdominis-adductor aponeuroses are intact.

Other findings

Miscellaneous: The urinary bladder is decompressed, limiting
evaluation. There does appear to be a diffuse mildly thickened
urinary bladder wall. Cannot exclude cystitis. The prostate and
seminal vesicles are grossly unremarkable.
IMPRESSION: :
IMPRESSION: 1. Mild bilateral anterior superior femoroacetabular anterosuperior
femoral head-neck junction convexities, CAM type bump deformities
that increased propensity for CAM type femoroacetabular impingement.
2. Lack of intra-articular fluid limits evaluation of the bilateral
acetabular Labra. There does appear to be linear increased fluid
signal suggesting bilateral superior acetabular chondrolabral tears,
as described above.
3. The urinary bladder is decompressed, limiting evaluation. There
does appear to be a diffuse mildly thickened urinary bladder wall.
Cannot exclude cystitis. Recommend clinical correlation. Urinalysis
may help further characterize, if clinically indicated.

## 2022-08-27 ENCOUNTER — Encounter: Payer: Self-pay | Admitting: Adult Health

## 2022-08-27 ENCOUNTER — Ambulatory Visit (INDEPENDENT_AMBULATORY_CARE_PROVIDER_SITE_OTHER): Payer: BC Managed Care – PPO | Admitting: Adult Health

## 2022-08-27 VITALS — BP 145/84 | HR 111 | Ht 71.0 in | Wt 260.0 lb

## 2022-08-27 DIAGNOSIS — Z87898 Personal history of other specified conditions: Secondary | ICD-10-CM

## 2022-08-27 DIAGNOSIS — F331 Major depressive disorder, recurrent, moderate: Secondary | ICD-10-CM

## 2022-08-27 DIAGNOSIS — F411 Generalized anxiety disorder: Secondary | ICD-10-CM | POA: Diagnosis not present

## 2022-08-27 DIAGNOSIS — F909 Attention-deficit hyperactivity disorder, unspecified type: Secondary | ICD-10-CM | POA: Diagnosis not present

## 2022-08-27 NOTE — Progress Notes (Addendum)
[facility] MD/PA/NP Initial Note  08/27/2022 1:30 PM Douglas Hawkins  MRN:  [id number]  Chief Complaint:   HPI:  Patient seen today for initial psychiatric evaluation.   Collateral in EPIC reviewed.  Describes mood today as "ok". Pleasant. Denies tearfulness. Mood symptoms - reports depression, anxiety, and irritability. Reports worry, rumination, and over thinking. Denies obsessive thoughts and acts. Mood is consistent. Stating "I'm doing alright". Reports he has been off of medications for a few years and is hoping to restart them. Reports taking medications for ADD, depression, and anxiety since teens. Stopped taking medications a year and 1/2 ago to see how he would do without them. Feels like his drive and motivation to get things done have decreased. He is planning to start a new job soon and feels restarting medications will be helpful. Stable interest and motivation. Taking medications as prescribed.  Energy levels fair. Active, does not have a regular exercise routine.  Enjoys some usual interests and activities. Single. Lives with parents - dog. Has an older sister in [place]. Spending time with family. Appetite adequate. Weight gain 260 pounds. Sleeps well most nights. Averages 9 hours. Focus and concentration difficulties. Diagnosed with ADD in late teens. Completing tasks. Managing aspects of household. Works in [employer]. Denies SI or HI.  Denies AH or VH. Denies self harm.  Denies substance use.   Previous medication trials: Adderall, Zoloft, Clonazepam, Concerta, Ritalin, Prozac, Buspar, Wellbutrin, and Effexor.  Visit Diagnosis: No diagnosis found.  Past Psychiatric History: Denies psychiatric hospitalization.   Past Medical History: No past medical history on file. No past surgical history on file.  Family Psychiatric History: Family history of mental illness - mother ADD.  Family History:  Family History  Problem Relation Age of Onset   ADD / ADHD  Mother    Hypertension Father    Diabetes Father    Diabetes Paternal Grandfather    Suicidality Cousin    Bipolar disorder Cousin     Social History:  Social History   Socioeconomic History   Marital status: Single    Spouse name: Not on file   Number of children: Not on file   Years of education: Not on file   Highest education level: Not on file  Occupational History   Not on file  Tobacco Use   Smoking status: Some Days   Smokeless tobacco: Never  Vaping Use   Vaping Use: Every day  Substance and Sexual Activity   Alcohol use: Yes    Comment: 2-3 drinks daily   Drug use: Not Currently   Sexual activity: Not on file  Other Topics Concern   Not on file  Social History Narrative   Not on file   Social Determinants of Health   Financial Resource Strain: Not on file  Food Insecurity: Not on file  Transportation Needs: Not on file  Physical Activity: Not on file  Stress: Not on file  Social Connections: Not on file    Allergies: No Known Allergies  Metabolic Disorder Labs: No results found for: "HGBA1C", "MPG" No results found for: "PROLACTIN" No results found for: "CHOL", "TRIG", "HDL", "CHOLHDL", "VLDL", "LDLCALC" No results found for: "TSH"  Therapeutic Level Labs: No results found for: "LITHIUM" No results found for: "VALPROATE" No results found for: "CBMZ"  Current Medications: Current Outpatient Medications  Medication Sig Dispense Refill   amphetamine-dextroamphetamine (ADDERALL XR) 25 MG 24 hr capsule Take 1 capsule by mouth 2 (two) times daily. 60 capsule 0   amphetamine-dextroamphetamine (ADDERALL)  10 MG tablet Take 1 tablet (10 mg total) by mouth daily with breakfast. 30 tablet 0   clonazePAM (KLONOPIN) 0.5 MG tablet Take 1 tablet (0.5 mg total) by mouth 2 (two) times daily as needed for anxiety. 60 tablet 0   Multiple Vitamin (MULTIVITAMIN) capsule Take 1 capsule by mouth daily.     sertraline (ZOLOFT) 100 MG tablet Take 1 tab po qd for 1  week, then increase to 1.5 tabs po qd 45 tablet 1   No current facility-administered medications for this visit.    Medication Side Effects: none  Orders placed this visit:  No orders of the defined types were placed in this encounter.   Psychiatric Specialty Exam:  Review of Systems  Musculoskeletal:  Negative for gait problem.  Neurological:  Negative for tremors.  Psychiatric/Behavioral:         Please refer to HPI    There were no vitals taken for this visit.There is no height or weight on file to calculate BMI.  General Appearance: Casual and Neat  Eye Contact:  Good  Speech:  Clear and Coherent and Normal Rate  Volume:  Normal  Mood:  Euthymic  Affect:  Appropriate and Congruent  Thought Process:  Coherent and Descriptions of Associations: Intact  Orientation:  Full (Time, Place, and Person)  Thought Content: Logical   Suicidal Thoughts:  No  Homicidal Thoughts:  No  Memory:  WNL  Judgement:  Good  Insight:  Good  Psychomotor Activity:  Normal  Concentration:  Concentration: Good and Attention Span: Good  Recall:  Good  Fund of Knowledge: Good  Language: Good  Assets:  Communication Skills Desire for Improvement Financial Resources/Insurance Housing Intimacy Leisure Time Physical Health Resilience Social Support Talents/Skills Transportation Vocational/Educational  ADL's:  Intact  Cognition: WNL  Prognosis:  Good   Screenings: MDQ  Receiving Psychotherapy: No   Treatment Plan/Recommendations:   Plan:  PDMP reviewed  After reviewing chart found 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 Reports going to a rehab a year ago and has done well. Also reports quiting alcohol use 3 weeks ago. Reports he was consuming a fifth a day. Reports he is involved in AA. He feels he is doing better and plans to maintain sobriety. He is asking to restart his previous  medication regimen with returning to work.   Patient advised that no controlled substances would be prescribed this visit. Patient was given a lab slip for a urine drug screen. Will review results before adding any controlled substances.   Addendum 09/01/2022: Drug screen negative. Will start Zoloft 50mg  daily and Adderall XR 25mg  daily.  Previous medications:mg daily Adderall XR 25mg  BID Adderall 10mg  in the afternoon Zoloft 100mg  daily Clonazepam 0.5mg  prn anxiety  RTC 4 weeks  Patient advised to contact office with any questions, adverse effects, or acute worsening in signs and symptoms.      Dorothyann Gibbs, NP

## 2022-08-29 LAB — DRUG SCREEN, URINE
Amphetamines, Urine: NEGATIVE ng/mL
Barbiturate screen, urine: NEGATIVE ng/mL
Benzodiazepine Quant, Ur: NEGATIVE ng/mL
Cannabinoid Quant, Ur: NEGATIVE ng/mL
Cocaine (Metab.): NEGATIVE ng/mL
Opiate Quant, Ur: NEGATIVE ng/mL
PCP Quant, Ur: NEGATIVE ng/mL

## 2022-09-01 MED ORDER — SERTRALINE HCL 50 MG PO TABS: 50.0000 mg | ORAL_TABLET | Freq: Every day | ORAL | 0 refills | Status: DC

## 2022-09-01 MED ORDER — AMPHETAMINE-DEXTROAMPHET ER 25 MG PO CP24: 25.0000 mg | ORAL_CAPSULE | ORAL | 0 refills | Status: DC

## 2022-09-01 NOTE — Addendum Note (Signed)
Addended by: Dorothyann Gibbs on: 09/01/2022 08:04 AM   Modules accepted: Orders

## 2022-09-17 ENCOUNTER — Encounter: Payer: Self-pay | Admitting: Adult Health

## 2022-09-17 ENCOUNTER — Ambulatory Visit (INDEPENDENT_AMBULATORY_CARE_PROVIDER_SITE_OTHER): Payer: BC Managed Care – PPO | Admitting: Adult Health

## 2022-09-17 DIAGNOSIS — F909 Attention-deficit hyperactivity disorder, unspecified type: Secondary | ICD-10-CM | POA: Diagnosis not present

## 2022-09-17 DIAGNOSIS — F331 Major depressive disorder, recurrent, moderate: Secondary | ICD-10-CM

## 2022-09-17 DIAGNOSIS — F411 Generalized anxiety disorder: Secondary | ICD-10-CM | POA: Diagnosis not present

## 2022-09-17 DIAGNOSIS — Z87898 Personal history of other specified conditions: Secondary | ICD-10-CM | POA: Diagnosis not present

## 2022-09-17 NOTE — Progress Notes (Signed)
Douglas Hawkins [id number] 24-Sep-1990 32 y.o.  Subjective:   Patient ID:  Douglas Hawkins is a 32 y.o. (DOB Aug 29, 1990) male.  Chief Complaint: No chief complaint on file.   HPI Douglas Hawkins presents to the office today for follow-up of MDD, GAD, ADHD, and remote hx of substance abuse.  Describes mood today as "ok". Pleasant. Denies tearfulness. Mood symptoms - reports depression, anxiety, and irritability. Denies panic attacks. Reports worry, rumination, and over thinking. Denies obsessive thoughts and acts. Mood is consistent. Stating "I'm doing alright". Restarted Zoloft 2 weeks ago and has not picked up the Adderall XR 25mg . Improved interest and motivation. Taking medications as prescribed.  Energy levels lower. Active, does not have a regular exercise routine.  Enjoys some usual interests and activities. Single. Lives with parents - dog. Has an older sister in [place]. Spending time with family. Appetite adequate. Weight loss 250 to 260 pounds. Sleeps well most nights. Averages 9 hours. Focus and concentration difficulties. Diagnosed with ADD in late teens. Completing tasks. Managing aspects of household. Currently unemployed. Denies SI or HI.  Denies AH or VH. Denies self harm.  Denies substance use.  Denies alcohol use x 4 weeks - Attends AA meetings.  Previous medication trials: Adderall, Zoloft, Clonazepam, Concerta, Ritalin, Prozac, Buspar, Wellbutrin, and Effexor.   Review of Systems:  Review of Systems  Musculoskeletal:  Negative for gait problem.  Neurological:  Negative for tremors.  Psychiatric/Behavioral:         Please refer to HPI    Medications: I have reviewed the patient's current medications.  Current Outpatient Medications  Medication Sig Dispense Refill   amphetamine-dextroamphetamine (ADDERALL XR) 25 MG 24 hr capsule Take 1 capsule by mouth 2 (two) times daily. 60 capsule 0   amphetamine-dextroamphetamine (ADDERALL XR)  25 MG 24 hr capsule Take 1 capsule by mouth every morning. 30 capsule 0   amphetamine-dextroamphetamine (ADDERALL) 10 MG tablet Take 1 tablet (10 mg total) by mouth daily with breakfast. 30 tablet 0   clonazePAM (KLONOPIN) 0.5 MG tablet Take 1 tablet (0.5 mg total) by mouth 2 (two) times daily as needed for anxiety. 60 tablet 0   Multiple Vitamin (MULTIVITAMIN) capsule Take 1 capsule by mouth daily.     sertraline (ZOLOFT) 100 MG tablet Take 1 tab po qd for 1 week, then increase to 1.5 tabs po qd 45 tablet 1   sertraline (ZOLOFT) 50 MG tablet Take 1 tablet (50 mg total) by mouth daily. 30 tablet 0   No current facility-administered medications for this visit.    Medication Side Effects: None  Allergies: No Known Allergies  No past medical history on file.  Past Medical History, Surgical history, Social history, and Family history were reviewed and updated as appropriate.   Please see review of systems for further details on the patient's review from today.   Objective:   Physical Exam:  There were no vitals taken for this visit.  Physical Exam Constitutional:      General: He is not in acute distress. Musculoskeletal:        General: No deformity.  Neurological:     Mental Status: He is alert and oriented to person, place, and time.     Coordination: Coordination normal.  Psychiatric:        Attention and Perception: Attention and perception normal. He does not perceive auditory or visual hallucinations.        Mood and Affect: Mood normal. Mood is not anxious or depressed. Affect  is not labile, blunt, angry or inappropriate.        Speech: Speech normal.        Behavior: Behavior normal.        Thought Content: Thought content normal. Thought content is not paranoid or delusional. Thought content does not include homicidal or suicidal ideation. Thought content does not include homicidal or suicidal plan.        Cognition and Memory: Cognition and memory normal.         Judgment: Judgment normal.     Comments: Insight intact     Lab Review:     Component Value Date/Time   NA 141 10/24/2018 0017   K 3.9 10/24/2018 0017   CL 106 10/24/2018 0017   CO2 22 10/24/2018 0017   GLUCOSE 101 (H) 10/24/2018 0017   BUN 9 10/24/2018 0017   CREATININE 0.81 10/24/2018 0017   CALCIUM 9.2 10/24/2018 0017   GFRNONAA >60 10/24/2018 0017   GFRAA >60 10/24/2018 0017       Component Value Date/Time   WBC 5.2 10/24/2018 0017   RBC 4.60 10/24/2018 0017   HGB 12.6 (L) 10/24/2018 0017   HCT 39.0 10/24/2018 0017   PLT 246 10/24/2018 0017   MCV 84.8 10/24/2018 0017   MCH 27.4 10/24/2018 0017   MCHC 32.3 10/24/2018 0017   RDW 13.1 10/24/2018 0017   LYMPHSABS 1.4 10/24/2018 0017   MONOABS 0.5 10/24/2018 0017   EOSABS 0.1 10/24/2018 0017   BASOSABS 0.0 10/24/2018 0017    No results found for: "POCLITH", "LITHIUM"   No results found for: "PHENYTOIN", "PHENOBARB", "VALPROATE", "CBMZ"   .res Assessment: Plan:    Treatment Plan/Recommendations:   Plan:  PDMP reviewed  After reviewing chart found 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 Reports going to a rehab a year ago and has done well. Also reports quiting alcohol use 3 weeks ago. Reports he was consuming a fifth a day. Reports he is involved in AA. He feels he is doing better and plans to maintain sobriety. He is asking to restart his previous medication regimen with returning to work.   Patient advised that no controlled substances would be prescribed this visit. Patient was given a lab slip for a urine drug screen. Will review results before adding any controlled substances.   Drug screen 08/27/2022 negative. Will start Zoloft 50mg  daily and Adderall XR 25mg  daily.  Previous medications:mg daily Adderall XR 25mg  BID Adderall 10mg  in the afternoon Zoloft 100mg  daily Clonazepam 0.5mg  prn anxiety  RTC 4  weeks  Patient advised to contact office with any questions, adverse effects, or acute worsening in signs and symptoms.   BP - 137/93 96  Discussed potential benefits, risks, and side effects of stimulants with patient to include increased heart rate, palpitations, insomnia, increased anxiety, increased irritability, or decreased appetite.  Instructed patient to contact office if experiencing any significant tolerability issues.   There are no diagnoses linked to this encounter.   Please see After Visit Summary for patient specific instructions.  No future appointments.  No orders of the defined types were placed in this encounter.   -------------------------------

## 2022-10-10 ENCOUNTER — Other Ambulatory Visit: Payer: Self-pay | Admitting: Internal Medicine

## 2022-10-10 DIAGNOSIS — R7989 Other specified abnormal findings of blood chemistry: Secondary | ICD-10-CM

## 2022-10-13 ENCOUNTER — Other Ambulatory Visit: Payer: BC Managed Care – PPO

## 2022-10-15 ENCOUNTER — Ambulatory Visit (INDEPENDENT_AMBULATORY_CARE_PROVIDER_SITE_OTHER): Payer: BC Managed Care – PPO | Admitting: Adult Health

## 2022-10-15 ENCOUNTER — Encounter: Payer: Self-pay | Admitting: Adult Health

## 2022-10-15 DIAGNOSIS — F331 Major depressive disorder, recurrent, moderate: Secondary | ICD-10-CM | POA: Diagnosis not present

## 2022-10-15 DIAGNOSIS — Z87898 Personal history of other specified conditions: Secondary | ICD-10-CM

## 2022-10-15 DIAGNOSIS — F411 Generalized anxiety disorder: Secondary | ICD-10-CM

## 2022-10-15 DIAGNOSIS — F909 Attention-deficit hyperactivity disorder, unspecified type: Secondary | ICD-10-CM | POA: Diagnosis not present

## 2022-10-15 MED ORDER — AMPHETAMINE-DEXTROAMPHET ER 30 MG PO CP24: 30.0000 mg | ORAL_CAPSULE | ORAL | 0 refills | Status: DC

## 2022-10-15 MED ORDER — SERTRALINE HCL 50 MG PO TABS: 50.0000 mg | ORAL_TABLET | Freq: Every day | ORAL | 0 refills | Status: DC

## 2022-10-15 NOTE — Progress Notes (Signed)
Remington Arco [id number] 1990/09/06 32 y.o.  Subjective:   Patient ID:  Douglas Hawkins is a 31 y.o. (DOB [birth year]) male.  Chief Complaint: No chief complaint on file.   HPI Douglas Hawkins presents to the office today for follow-up of MDD, GAD, ADHD, and remote hx of substance abuse.  Describes mood today as "ok". Pleasant. Denies tearfulness. Mood symptoms - reports some depression. Denies anxiety and irritability. Denies panic attacks. Reports some worry, rumination, and over thinking. Denies obsessive thoughts and acts. Mood is consistent. Stating "I'm doing better". Feels like medications are helpful. Improved interest and motivation. Taking medications as prescribed.  Energy levels lower. Active, does not have a regular exercise routine.  Enjoys some usual interests and activities. Single. Lives with parents - dog. Has an older sister in [place]. Spending time with family. Appetite adequate. Weight loss 250 to 260 pounds. Sleeps well most nights. Averages 8 hours. Focus and concentration improved - medication not lasting throughout his day. Completing tasks. Managing aspects of household. Working full time. Denies SI or HI.  Denies AH or VH. Denies self harm.  Denies substance use.  Denies alcohol use x 8 weeks - Attends AA meetings 3 days a week.  Previous medication trials: Adderall, Zoloft, Clonazepam, Concerta, Ritalin, Prozac, Buspar, Wellbutrin, and Effexor.        Review of Systems:  Review of Systems  Musculoskeletal:  Negative for gait problem.  Neurological:  Negative for tremors.  Psychiatric/Behavioral:         Please refer to HPI    Medications: I have reviewed the patient's current medications.  Current Outpatient Medications  Medication Sig Dispense Refill   amphetamine-dextroamphetamine (ADDERALL XR) 25 MG 24 hr capsule Take 1 capsule by mouth every morning. 30 capsule 0   amphetamine-dextroamphetamine (ADDERALL) 10 MG  tablet Take 1 tablet (10 mg total) by mouth daily with breakfast. 30 tablet 0   clonazePAM (KLONOPIN) 0.5 MG tablet Take 1 tablet (0.5 mg total) by mouth 2 (two) times daily as needed for anxiety. 60 tablet 0   Multiple Vitamin (MULTIVITAMIN) capsule Take 1 capsule by mouth daily.     sertraline (ZOLOFT) 50 MG tablet Take 1 tablet (50 mg total) by mouth daily. 30 tablet 0   No current facility-administered medications for this visit.    Medication Side Effects: None  Allergies: No Known Allergies  No past medical history on file.  Past Medical History, Surgical history, Social history, and Family history were reviewed and updated as appropriate.   Please see review of systems for further details on the patient's review from today.   Objective:   Physical Exam:  There were no vitals taken for this visit.  Physical Exam Constitutional:      General: He is not in acute distress. Musculoskeletal:        General: No deformity.  Neurological:     Mental Status: He is alert and oriented to person, place, and time.     Coordination: Coordination normal.  Psychiatric:        Attention and Perception: Attention and perception normal. He does not perceive auditory or visual hallucinations.        Mood and Affect: Mood normal. Mood is not anxious or depressed. Affect is not labile, blunt, angry or inappropriate.        Speech: Speech normal.        Behavior: Behavior normal.        Thought Content: Thought content normal. Thought content is  not paranoid or delusional. Thought content does not include homicidal or suicidal ideation. Thought content does not include homicidal or suicidal plan.        Cognition and Memory: Cognition and memory normal.        Judgment: Judgment normal.     Comments: Insight intact     Lab Review:     Component Value Date/Time   NA 141 10/24/2018 0017   K 3.9 10/24/2018 0017   CL 106 10/24/2018 0017   CO2 22 10/24/2018 0017   GLUCOSE 101 (H)  10/24/2018 0017   BUN 9 10/24/2018 0017   CREATININE 0.81 10/24/2018 0017   CALCIUM 9.2 10/24/2018 0017   GFRNONAA >60 10/24/2018 0017   GFRAA >60 10/24/2018 0017       Component Value Date/Time   WBC 5.2 10/24/2018 0017   RBC 4.60 10/24/2018 0017   HGB 12.6 (L) 10/24/2018 0017   HCT 39.0 10/24/2018 0017   PLT 246 10/24/2018 0017   MCV 84.8 10/24/2018 0017   MCH 27.4 10/24/2018 0017   MCHC 32.3 10/24/2018 0017   RDW 13.1 10/24/2018 0017   LYMPHSABS 1.4 10/24/2018 0017   MONOABS 0.5 10/24/2018 0017   EOSABS 0.1 10/24/2018 0017   BASOSABS 0.0 10/24/2018 0017    No results found for: "POCLITH", "LITHIUM"   No results found for: "PHENYTOIN", "PHENOBARB", "VALPROATE", "CBMZ"   .res Assessment: Plan:    Plan:  Zoloft 50mg  daily Adderall XR 25mg  to 30mg  daily.    There are no diagnoses linked to this encounter.   Please see After Visit Summary for patient specific instructions.  Future Appointments  Date Time Provider Department Center  10/17/2022 10:30 AM [facility] [place] [facility]    No orders of the defined types were placed in this encounter.   -------------------------------

## 2022-10-17 ENCOUNTER — Ambulatory Visit
Admission: RE | Admit: 2022-10-17 | Discharge: 2022-10-17 | Disposition: A | Discharge status: Home / Self Care | Payer: BC Managed Care – PPO | Source: Ambulatory Visit | Attending: Internal Medicine | Admitting: Internal Medicine

## 2022-10-17 DIAGNOSIS — R7989 Other specified abnormal findings of blood chemistry: Secondary | ICD-10-CM

## 2022-10-18 ENCOUNTER — Encounter (HOSPITAL_BASED_OUTPATIENT_CLINIC_OR_DEPARTMENT_OTHER): Payer: Self-pay

## 2022-10-18 ENCOUNTER — Emergency Department (HOSPITAL_BASED_OUTPATIENT_CLINIC_OR_DEPARTMENT_OTHER)
Admission: EM | Admit: 2022-10-18 | Discharge: 2022-10-18 | Disposition: A | Discharge status: Home / Self Care | Payer: BC Managed Care – PPO | Attending: Emergency Medicine | Admitting: Emergency Medicine

## 2022-10-18 ENCOUNTER — Emergency Department (HOSPITAL_BASED_OUTPATIENT_CLINIC_OR_DEPARTMENT_OTHER): Payer: BC Managed Care – PPO

## 2022-10-18 DIAGNOSIS — R160 Hepatomegaly, not elsewhere classified: Secondary | ICD-10-CM

## 2022-10-18 DIAGNOSIS — K760 Fatty (change of) liver, not elsewhere classified: Secondary | ICD-10-CM | POA: Diagnosis not present

## 2022-10-18 DIAGNOSIS — R7401 Elevation of levels of liver transaminase levels: Secondary | ICD-10-CM

## 2022-10-18 DIAGNOSIS — Y909 Presence of alcohol in blood, level not specified: Secondary | ICD-10-CM | POA: Insufficient documentation

## 2022-10-18 DIAGNOSIS — F1010 Alcohol abuse, uncomplicated: Secondary | ICD-10-CM | POA: Insufficient documentation

## 2022-10-18 DIAGNOSIS — R7989 Other specified abnormal findings of blood chemistry: Secondary | ICD-10-CM | POA: Diagnosis present

## 2022-10-18 LAB — CBC
HCT: 43.9 % (ref 39.0–52.0)
Hemoglobin: 15.5 g/dL (ref 13.0–17.0)
MCH: 31.1 pg (ref 26.0–34.0)
MCHC: 35.3 g/dL (ref 30.0–36.0)
MCV: 88.2 fL (ref 80.0–100.0)
Platelets: 172 10*3/uL (ref 150–400)
RBC: 4.98 MIL/uL (ref 4.22–5.81)
RDW: 14 % (ref 11.5–15.5)
WBC: 4.2 10*3/uL (ref 4.0–10.5)
nRBC: 0 % (ref 0.0–0.2)

## 2022-10-18 LAB — COMPREHENSIVE METABOLIC PANEL
ALT: 755 U/L — ABNORMAL HIGH (ref 0–44)
AST: 857 U/L — ABNORMAL HIGH (ref 15–41)
Albumin: 4.8 g/dL (ref 3.5–5.0)
Alkaline Phosphatase: 134 U/L — ABNORMAL HIGH (ref 38–126)
Anion gap: 17 — ABNORMAL HIGH (ref 5–15)
BUN: 7 mg/dL (ref 6–20)
CO2: 16 mmol/L — ABNORMAL LOW (ref 22–32)
Calcium: 9.7 mg/dL (ref 8.9–10.3)
Chloride: 103 mmol/L (ref 98–111)
Creatinine, Ser: 0.72 mg/dL (ref 0.61–1.24)
GFR, Estimated: >60 mL/min (ref >60)
Glucose, Bld: 93 mg/dL (ref 70–99)
Potassium: 3.9 mmol/L (ref 3.5–5.1)
Sodium: 136 mmol/L (ref 135–145)
Total Bilirubin: 1.2 mg/dL (ref 0.3–1.2)
Total Protein: 7.7 g/dL (ref 6.5–8.1)

## 2022-10-18 LAB — PROTIME-INR
INR: 1 (ref 0.8–1.2)
Prothrombin Time: 13 seconds (ref 11.4–15.2)

## 2022-10-18 MED ORDER — LORAZEPAM 2 MG/ML IJ SOLN: 0.0000 mg | Freq: Two times a day (BID) | INTRAMUSCULAR | Status: DC

## 2022-10-18 MED ORDER — LORAZEPAM 1 MG PO TABS: 0.0000 mg | ORAL_TABLET | Freq: Four times a day (QID) | ORAL | Status: DC

## 2022-10-18 MED ORDER — IOHEXOL 300 MG/ML  SOLN
100.0000 mL | Freq: Once | INTRAMUSCULAR | Status: AC | PRN
  Administered 2022-10-18: 100 mL via INTRAVENOUS

## 2022-10-18 MED ORDER — THIAMINE HCL 100 MG/ML IJ SOLN: 100.0000 mg | Freq: Every day | INTRAMUSCULAR | Status: DC

## 2022-10-18 MED ORDER — THIAMINE MONONITRATE 100 MG PO TABS: 100.0000 mg | ORAL_TABLET | Freq: Every day | ORAL | Status: DC

## 2022-10-18 MED ORDER — LORAZEPAM 1 MG PO TABS: 0.0000 mg | ORAL_TABLET | Freq: Two times a day (BID) | ORAL | Status: DC

## 2022-10-18 MED ORDER — LORAZEPAM 2 MG/ML IJ SOLN: 0.0000 mg | Freq: Four times a day (QID) | INTRAMUSCULAR | Status: DC

## 2022-10-18 NOTE — ED Notes (Signed)
Reviewed AVS/discharge instruction with patient. Time allotted for and all questions answered. Patient is agreeable for d/c and escorted to ed exit by staff.  

## 2022-10-18 NOTE — ED Provider Notes (Signed)
[place] EMERGENCY DEPARTMENT AT [facility] Provider Note   CSN: [id number] Arrival date & time: 10/18/22  1704     History {Add pertinent medical, surgical, social history, OB history to HPI:1} Chief Complaint  Patient presents with   Abnormal Lab    Douglas Hawkins is a 32 y.o. male.  Patient is a 32 year old male with past medical history of alcohol abuse presenting after the instruction of his primary care physician to go to the emergency department for elevated liver enzymes.  Patient denies any abdominal pain, nausea, vomiting, pruritus, or discoloration of skin.  Patient denies any fevers or chills.  States his primary care physician was following his liver enzymes with most recent studies on 6/11 with AST 298 ALT 315 and 6/14 AST 281 ALT 344. Labs drew yesterday demonstrate AST and ALT   The history is provided by the patient. No language interpreter was used.  Abnormal Lab      Home Medications Prior to Admission medications   Medication Sig Start Date End Date Taking? Authorizing Provider  amphetamine-dextroamphetamine (ADDERALL XR) 30 MG 24 hr capsule Take 1 capsule (30 mg total) by mouth every morning. 10/15/22   Mozingo, Thereasa Solo, NP  Multiple Vitamin (MULTIVITAMIN) capsule Take 1 capsule by mouth daily.    [provider]  sertraline (ZOLOFT) 50 MG tablet Take 1 tablet (50 mg total) by mouth daily. 10/15/22   Mozingo, Thereasa Solo, NP      Allergies    Patient has no known allergies.    Review of Systems   Review of Systems  Constitutional:  Negative for chills and fever.  HENT:  Negative for ear pain and sore throat.   Eyes:  Negative for pain and visual disturbance.  Respiratory:  Negative for cough and shortness of breath.   Cardiovascular:  Negative for chest pain and palpitations.  Gastrointestinal:  Negative for abdominal pain and vomiting.  Genitourinary:  Negative for dysuria and hematuria.  Musculoskeletal:   Negative for arthralgias and back pain.  Skin:  Negative for color change and rash.  Neurological:  Negative for seizures and syncope.  All other systems reviewed and are negative.   Physical Exam Updated Vital Signs BP 129/81 (BP Location: Right Arm)   Pulse (!) 120   Temp 98.2 F (36.8 C) (Oral)   Resp 18   Ht 6' (1.829 m)   Wt 114.8 kg   SpO2 97%   BMI 34.31 kg/m  Physical Exam Vitals and nursing note reviewed.  Constitutional:      General: He is not in acute distress.    Appearance: He is well-developed.  HENT:     Head: Normocephalic and atraumatic.  Eyes:     Conjunctiva/sclera: Conjunctivae normal.  Cardiovascular:     Rate and Rhythm: Normal rate and regular rhythm.     Heart sounds: No murmur heard. Pulmonary:     Effort: Pulmonary effort is normal. No respiratory distress.     Breath sounds: Normal breath sounds.  Abdominal:     Palpations: Abdomen is soft.     Tenderness: There is no abdominal tenderness.  Musculoskeletal:        General: No swelling.     Cervical back: Neck supple.  Skin:    General: Skin is warm and dry.     Capillary Refill: Capillary refill takes less than 2 seconds.  Neurological:     Mental Status: He is alert.  Psychiatric:        Mood  and Affect: Mood normal.     ED Results / Procedures / Treatments   Labs (all labs ordered are listed, but only abnormal results are displayed) Labs Reviewed  COMPREHENSIVE METABOLIC PANEL - Abnormal; Notable for the following components:      Result Value   CO2 16 (*)    AST 857 (*)    ALT 755 (*)    Alkaline Phosphatase 134 (*)    Anion gap 17 (*)    All other components within normal limits  CBC  PROTIME-INR    EKG None  Radiology US Abdomen Limited RUQ (LIVER/GB)  Result Date: 10/17/2022 CLINICAL DATA:  Increased liver function tests. EXAM: ULTRASOUND ABDOMEN LIMITED RIGHT UPPER QUADRANT COMPARISON:  None Available. FINDINGS: Gallbladder: No gallstones or wall thickening  visualized. No sonographic Murphy sign noted by sonographer. Common bile duct: Diameter: 3 mm Liver: No focal lesion identified. Increased parenchymal echogenicity and coarsened echotexture. Portal vein is patent on color Doppler imaging with normal direction of blood flow towards the liver. Other: None. IMPRESSION: 1. Hepatic steatosis. 2. No cholelithiasis or evidence of acute cholecystitis. Electronically Signed   By: Romona Curls M.D.   On: 10/17/2022 15:17    Procedures Procedures  {Document cardiac monitor, telemetry assessment procedure when appropriate:1}  Medications Ordered in ED Medications - No data to display  ED Course/ Medical Decision Making/ A&P   {   Click here for ABCD2, HEART and other calculatorsREFRESH Note before signing :1}                          Medical Decision Making Amount and/or Complexity of Data Reviewed Labs: ordered. Radiology: ordered.   ***  {Document critical care time when appropriate:1} {Document review of labs and clinical decision tools ie heart score, Chads2Vasc2 etc:1}  {Document your independent review of radiology images, and any outside records:1} {Document your discussion with family members, caretakers, and with consultants:1} {Document social determinants of health affecting pt's care:1} {Document your decision making why or why not admission, treatments were needed:1} Final Clinical Impression(s) / ED Diagnoses Final diagnoses:  None    Rx / DC Orders ED Discharge Orders     None

## 2022-10-18 NOTE — ED Notes (Signed)
Patient transported to CT 

## 2022-10-18 NOTE — ED Triage Notes (Signed)
Pt states that he had elevated liver enzymes that are not appropriate to drinking (approx a fifth a day). Pt states he recently had liver US that showed fatty liver.  Pt advised by PCP to see gastro .

## 2022-10-24 ENCOUNTER — Other Ambulatory Visit (HOSPITAL_COMMUNITY): Payer: Self-pay | Admitting: Nurse Practitioner

## 2022-10-24 DIAGNOSIS — B179 Acute viral hepatitis, unspecified: Secondary | ICD-10-CM

## 2022-10-24 DIAGNOSIS — F1010 Alcohol abuse, uncomplicated: Secondary | ICD-10-CM

## 2022-10-24 DIAGNOSIS — R7989 Other specified abnormal findings of blood chemistry: Secondary | ICD-10-CM

## 2022-11-04 ENCOUNTER — Other Ambulatory Visit (HOSPITAL_COMMUNITY): Payer: Self-pay | Admitting: Student

## 2022-11-04 ENCOUNTER — Other Ambulatory Visit: Payer: Self-pay | Admitting: Radiology

## 2022-11-04 DIAGNOSIS — R7989 Other specified abnormal findings of blood chemistry: Secondary | ICD-10-CM

## 2022-11-04 NOTE — H&P (Signed)
Chief Complaint: Patient was seen in consultation today for elevated liver function labs at the request of Drazek,Dawn  Referring Physician(s): Drazek,Dawn  Supervising Physician: Pernell Dupre  Patient Status: [facility] - Out-pt  History of Present Illness: Douglas Hawkins is a 32 y.o. male with PMHx significant for alcohol use, neuropathy, depression, tourette's syndrome.   Patient is referred to IR for a liver biopsy from the transplant team with elevated LFTs, acute hepatitis, known alcohol use, previous RUQ ultrasound with hepatic steatosis.  History reviewed. No pertinent past medical history.  History reviewed. No pertinent surgical history.  Allergies: Patient has no known allergies.  Medications: Prior to Admission medications   Medication Sig Start Date End Date Taking? Authorizing Provider  amphetamine-dextroamphetamine (ADDERALL XR) 30 MG 24 hr capsule Take 1 capsule (30 mg total) by mouth every morning. 10/15/22   Mozingo, Thereasa Solo, NP  Multiple Vitamin (MULTIVITAMIN) capsule Take 1 capsule by mouth daily.    [provider]  sertraline (ZOLOFT) 50 MG tablet Take 1 tablet (50 mg total) by mouth daily. 10/15/22   Mozingo, Thereasa Solo, NP     Family History  Problem Relation Age of Onset   ADD / ADHD Mother    Hypertension Father    Diabetes Father    Diabetes Paternal Grandfather    Suicidality Cousin    Bipolar disorder Cousin     Social History   Socioeconomic History   Marital status: Single    Spouse name: Not on file   Number of children: Not on file   Years of education: Not on file   Highest education level: Not on file  Occupational History   Not on file  Tobacco Use   Smoking status: Some Days   Smokeless tobacco: Never  Vaping Use   Vaping Use: Every day  Substance and Sexual Activity   Alcohol use: Yes    Comment: fifth/day   Drug use: Not Currently    Comment: last IVD use 12/2020   Sexual activity:  Not on file  Other Topics Concern   Not on file  Social History Narrative   Not on file   Social Determinants of Health   Financial Resource Strain: Not on file  Food Insecurity: Not on file  Transportation Needs: Not on file  Physical Activity: Not on file  Stress: Not on file  Social Connections: Not on file    Review of Systems: A 12 point ROS discussed and pertinent positives are indicated in the HPI above.  All other systems are negative.  Review of Systems  Vital Signs: There were no vitals taken for this visit.   Physical Exam  Imaging: CT ABDOMEN PELVIS W CONTRAST  Result Date: 10/18/2022 CLINICAL DATA:  Abdominal pain, acute, nonlocalized transamitinits, alcoholism, abdominal pain Pt states that he had elevated liver enzymes that are not appropriate to drinking (approx a fifth a day). EXAM: CT ABDOMEN AND PELVIS WITH CONTRAST TECHNIQUE: Multidetector CT imaging of the abdomen and pelvis was performed using the standard protocol following bolus administration of intravenous contrast. RADIATION DOSE REDUCTION: This exam was performed according to the departmental dose-optimization program which includes automated exposure control, adjustment of the mA and/or kV according to patient size and/or use of iterative reconstruction technique. CONTRAST:  OMNIPAQUE IOHEXOL 300 MG/ML  SOLN COMPARISON:  Ultrasound abdomen 10/17/2022 FINDINGS: Lower chest: No acute abnormality. Hepatobiliary: The liver is enlarged measuring up to 24 cm. The hepatic parenchyma is markedly hypodense compared to the splenic  parenchyma consistent with fatty infiltration. No focal liver abnormality. No gallstones, gallbladder wall thickening, or pericholecystic fluid. No biliary dilatation. Pancreas: No focal lesion. Normal pancreatic contour. No surrounding inflammatory changes. No main pancreatic ductal dilatation. Spleen: Normal in size without focal abnormality. Adrenals/Urinary Tract: No adrenal nodule  bilaterally. Bilateral kidneys enhance symmetrically. Fluid density lesion within left kidney likely represents a simple renal cyst. Simple renal cysts, in the absence of clinically indicated signs/symptoms, require no independent follow-up. No hydronephrosis. No hydroureter. The urinary bladder is unremarkable. Stomach/Bowel: Stomach is within normal limits. No evidence of bowel wall thickening or dilatation. Appendix appears normal. Vascular/Lymphatic: No abdominal aorta or iliac aneurysm. Prominent but nonenlarged porta hepatic and upper abdominal lymph nodes (2:34, 39). No abdominal, pelvic, or inguinal lymphadenopathy. Reproductive: Prostate is unremarkable. Other: No intraperitoneal free fluid. No intraperitoneal free gas. No organized fluid collection. Musculoskeletal: Partially visualized fluid density lesion along the superficial soft tissues of the right lower anterior chest. Partially visualized bilateral gynecomastia. No suspicious lytic or blastic osseous lesions. No acute displaced fracture. Multilevel degenerative changes of the spine. IMPRESSION: 1. No acute intra-abdominal or intrapelvic abnormality. 2. Marked hepatic steatosis and hepatomegaly. 3. Nonspecific prominent but nonenlarged porta hepatic and upper abdominal lymph nodes. 4. Partially visualized fluid density lesion along the superficial soft tissues of the right lower anterior chest. Correlate with physical exam-likely benign sebaceous cyst. Electronically Signed   By: Tish Frederickson M.D.   On: 10/18/2022 21:23   US Abdomen Limited RUQ (LIVER/GB)  Result Date: 10/17/2022 CLINICAL DATA:  Increased liver function tests. EXAM: ULTRASOUND ABDOMEN LIMITED RIGHT UPPER QUADRANT COMPARISON:  None Available. FINDINGS: Gallbladder: No gallstones or wall thickening visualized. No sonographic Murphy sign noted by sonographer. Common bile duct: Diameter: 3 mm Liver: No focal lesion identified. Increased parenchymal echogenicity and coarsened  echotexture. Portal vein is patent on color Doppler imaging with normal direction of blood flow towards the liver. Other: None. IMPRESSION: 1. Hepatic steatosis. 2. No cholelithiasis or evidence of acute cholecystitis. Electronically Signed   By: Romona Curls M.D.   On: 10/17/2022 15:17    Labs:  CBC: Recent Labs    10/18/22 1732  WBC 4.2  HGB 15.5  HCT 43.9  PLT 172    COAGS: Recent Labs    10/18/22 2038  INR 1.0    BMP: Recent Labs    10/18/22 1732  NA 136  K 3.9  CL 103  CO2 16*  GLUCOSE 93  BUN 7  CALCIUM 9.7  CREATININE 0.72  GFRNONAA >60    LIVER FUNCTION TESTS: Recent Labs    10/18/22 1732  BILITOT 1.2  AST 857*  ALT 755*  ALKPHOS 134*  PROT 7.7  ALBUMIN 4.8    TUMOR MARKERS: No results for input(s): "AFPTM", "CEA", "CA199", "CHROMGRNA" in the last 8760 hours.  Assessment and Plan: This is a 32 year old male with PMHx significant for alcohol use, neuropathy, depression, tourette's syndrome.   Patient is referred to IR for a liver biopsy from the transplant team with elevated LFTs, acute hepatitis, known alcohol use, previous RUQ ultrasound with hepatic steatosis.  The patient has been NPO, no blood thinners taken, labs and vitals have been reviewed.  Risks and benefits of image guided liver biopsy with moderate sedation was discussed with the patient and/or patient's family including, but not limited to bleeding, infection, damage to adjacent structures or low yield requiring additional tests.  All of the questions were answered and there is agreement to proceed.  Consent signed and in chart.   Thank you for this interesting consult.  I greatly enjoyed meeting Douglas Hawkins and look forward to participating in their care.  A copy of this report was sent to the requesting provider on this date.  Electronically Signed: Berneta Levins, PA-C 11/04/2022, 1:18 PM   I spent a total of 15 Minutes in face to face in clinical  consultation, greater than 50% of which was counseling/coordinating care for elevated liver function labs.

## 2022-11-05 NOTE — Progress Notes (Signed)
Patient for US guided Liver Biopsy on Thurs 11/06/2022, I called and spoke with the patient on the phone and gave pre-procedure instructions. Pt was made aware to be here at 9a, NPO after MN prior to procedure as well as driver post procedure/recovery/discharge. Pt stated understanding.  Called 11/05/2022

## 2022-11-06 ENCOUNTER — Ambulatory Visit
Admission: RE | Admit: 2022-11-06 | Discharge: 2022-11-06 | Disposition: A | Discharge status: Home / Self Care | Payer: BC Managed Care – PPO | Source: Ambulatory Visit | Attending: Nurse Practitioner | Admitting: Nurse Practitioner

## 2022-11-06 ENCOUNTER — Other Ambulatory Visit: Payer: Self-pay

## 2022-11-06 DIAGNOSIS — G629 Polyneuropathy, unspecified: Secondary | ICD-10-CM | POA: Insufficient documentation

## 2022-11-06 DIAGNOSIS — F32A Depression, unspecified: Secondary | ICD-10-CM | POA: Insufficient documentation

## 2022-11-06 DIAGNOSIS — R7989 Other specified abnormal findings of blood chemistry: Secondary | ICD-10-CM | POA: Diagnosis not present

## 2022-11-06 DIAGNOSIS — F952 Tourette's disorder: Secondary | ICD-10-CM | POA: Diagnosis not present

## 2022-11-06 DIAGNOSIS — B179 Acute viral hepatitis, unspecified: Secondary | ICD-10-CM | POA: Insufficient documentation

## 2022-11-06 DIAGNOSIS — F1090 Alcohol use, unspecified, uncomplicated: Secondary | ICD-10-CM | POA: Insufficient documentation

## 2022-11-06 DIAGNOSIS — K7581 Nonalcoholic steatohepatitis (NASH): Secondary | ICD-10-CM | POA: Diagnosis not present

## 2022-11-06 DIAGNOSIS — F1010 Alcohol abuse, uncomplicated: Secondary | ICD-10-CM

## 2022-11-06 HISTORY — DX: Sprain of anterior cruciate ligament of unspecified knee, initial encounter: S83.519A

## 2022-11-06 HISTORY — DX: Attention-deficit hyperactivity disorder, unspecified type: F90.9

## 2022-11-06 MED ORDER — SODIUM CHLORIDE 0.9 % IV SOLN: INTRAVENOUS | Status: DC

## 2022-11-06 MED ORDER — MIDAZOLAM HCL 2 MG/2ML IJ SOLN
INTRAMUSCULAR | Status: AC
  Filled 2022-11-06: qty 4

## 2022-11-06 MED ORDER — FLUMAZENIL 0.5 MG/5ML IV SOLN
INTRAVENOUS | Status: AC
  Filled 2022-11-06: qty 5

## 2022-11-06 MED ORDER — LIDOCAINE HCL (PF) 1 % IJ SOLN
10.0000 mL | Freq: Once | INTRAMUSCULAR | Status: AC
  Administered 2022-11-06: 10 mL via INTRADERMAL

## 2022-11-06 MED ORDER — FENTANYL CITRATE (PF) 100 MCG/2ML IJ SOLN
INTRAMUSCULAR | Status: AC
  Filled 2022-11-06: qty 2

## 2022-11-06 MED ORDER — FENTANYL CITRATE (PF) 100 MCG/2ML IJ SOLN
INTRAMUSCULAR | Status: AC | PRN
  Administered 2022-11-06 (×3): 50 ug via INTRAVENOUS

## 2022-11-06 MED ORDER — FENTANYL CITRATE (PF) 100 MCG/2ML IJ SOLN
INTRAMUSCULAR | Status: AC
  Filled 2022-11-06: qty 4

## 2022-11-06 MED ORDER — NALOXONE HCL 2 MG/2ML IJ SOSY
PREFILLED_SYRINGE | INTRAMUSCULAR | Status: AC
  Filled 2022-11-06: qty 2

## 2022-11-06 MED ORDER — MIDAZOLAM HCL 2 MG/2ML IJ SOLN
INTRAMUSCULAR | Status: AC | PRN
  Administered 2022-11-06 (×3): 1 mg via INTRAVENOUS

## 2022-11-06 NOTE — Procedures (Signed)
Interventional Radiology Procedure Note  Date of Procedure: 11/06/2022  Procedure: [place] random liver core needle biopsy   Findings:  1. Core needle biopsy right lobe 18ga x2 cores    Complications: No immediate complications noted.   Estimated Blood Loss: minimal  Follow-up and Recommendations: 1. Bedrest 2 hours    Olive Bass, MD  Vascular & Interventional Radiology  11/06/2022 11:29 AM

## 2022-11-06 NOTE — OR Nursing (Addendum)
pt reporting felling like he is going through etoh withdrawal and do we have anything for that. when questioned he said he drank last night. Explained withdrawal typically starts 48-72 post last drink. He reports feeling anxious and sweaty. BP 138/93, heart rate 120.  MD notified

## 2022-11-12 ENCOUNTER — Ambulatory Visit (INDEPENDENT_AMBULATORY_CARE_PROVIDER_SITE_OTHER): Payer: BC Managed Care – PPO | Admitting: Adult Health

## 2022-11-12 ENCOUNTER — Encounter: Payer: Self-pay | Admitting: Adult Health

## 2022-11-12 DIAGNOSIS — F411 Generalized anxiety disorder: Secondary | ICD-10-CM

## 2022-11-12 DIAGNOSIS — F331 Major depressive disorder, recurrent, moderate: Secondary | ICD-10-CM | POA: Diagnosis not present

## 2022-11-12 DIAGNOSIS — F909 Attention-deficit hyperactivity disorder, unspecified type: Secondary | ICD-10-CM | POA: Diagnosis not present

## 2022-11-12 DIAGNOSIS — Z87898 Personal history of other specified conditions: Secondary | ICD-10-CM | POA: Diagnosis not present

## 2022-11-12 MED ORDER — SERTRALINE HCL 50 MG PO TABS
50.0000 mg | ORAL_TABLET | Freq: Every day | ORAL | 0 refills | Status: DC
Start: 2022-11-12 — End: 2022-12-11

## 2022-11-12 MED ORDER — CLONAZEPAM 0.5 MG PO TABS
0.5000 mg | ORAL_TABLET | Freq: Two times a day (BID) | ORAL | 0 refills | Status: DC | PRN
Start: 2022-11-12 — End: 2022-12-11

## 2022-11-12 MED ORDER — AMPHETAMINE-DEXTROAMPHET ER 30 MG PO CP24
30.0000 mg | ORAL_CAPSULE | ORAL | 0 refills | Status: DC
Start: 2022-11-12 — End: 2022-12-11

## 2022-11-12 NOTE — Progress Notes (Signed)
[name] [id number] 04-24-1991 32 y.o.  Subjective:   Patient ID:  Douglas Hawkins is a 32 y.o. (DOB 07/18/1990) male.  Chief Complaint: No chief complaint on file.   HPI Douglas Hawkins presents to the office today for follow-up of MDD, GAD, ADHD, and remote hx of substance abuse.  Describes mood today as "ok". Pleasant. Denies tearfulness. Mood symptoms - reports depression and irritability. Reports increased anxiety - work related. Denies panic attacks - but reports anxiety attacks. Denies worry and rumination. Reports over thinking. Denies obsessive thoughts and acts. Mood is consistent. Stating "I'm doing better than I was, baby steps". Feels like medications are helpful, but would like to restart Clonazepam for increased periods of anxiety. Improved interest and motivation. Taking medications as prescribed.  Energy levels improved. Active, does not have a regular exercise routine.  Enjoys some usual interests and activities. Single. Lives with parents - dog. Has an older sister in [place]. Spending time with family. Appetite adequate. Weight stable 249 pounds. Sleeps well most nights. Averages 8 hours. Focus and concentration improved - medication not lasting throughout his day. Completing tasks. Managing aspects of household. Working full time [employer]. Denies SI or HI.  Denies AH or VH. Denies self harm.  Denies substance use.  Denies alcohol use x 12 weeks - Attends AA meetings 3 days a week.  Previous medication trials: Adderall, Zoloft, Clonazepam, Concerta, Ritalin, Prozac, Buspar, Wellbutrin, and Effexor.    Flowsheet Row ED from 10/18/2022 in [facility] Emergency Department at [facility]  C-SSRS RISK CATEGORY No Risk        Review of Systems:  Review of Systems  Musculoskeletal:  Negative for gait problem.  Neurological:  Negative for tremors.  Psychiatric/Behavioral:         Please refer to HPI    Medications: I  have reviewed the patient's current medications.  Current Outpatient Medications  Medication Sig Dispense Refill   amphetamine-dextroamphetamine (ADDERALL XR) 30 MG 24 hr capsule Take 1 capsule (30 mg total) by mouth every morning. 30 capsule 0   Multiple Vitamin (MULTIVITAMIN) capsule Take 1 capsule by mouth daily.     sertraline (ZOLOFT) 50 MG tablet Take 1 tablet (50 mg total) by mouth daily. (Patient not taking: Reported on 11/06/2022) 30 tablet 0   No current facility-administered medications for this visit.    Medication Side Effects: None  Allergies: No Known Allergies  Past Medical History:  Diagnosis Date   ACL injury tear    left   ADHD (attention deficit hyperactivity disorder)     Past Medical History, Surgical history, Social history, and Family history were reviewed and updated as appropriate.   Please see review of systems for further details on the patient's review from today.   Objective:   Physical Exam:  There were no vitals taken for this visit.  Physical Exam Constitutional:      General: He is not in acute distress. Musculoskeletal:        General: No deformity.  Neurological:     Mental Status: He is alert and oriented to person, place, and time.     Coordination: Coordination normal.  Psychiatric:        Attention and Perception: Attention and perception normal. He does not perceive auditory or visual hallucinations.        Mood and Affect: Affect is not labile, blunt, angry or inappropriate.        Speech: Speech normal.  Behavior: Behavior normal.        Thought Content: Thought content normal. Thought content is not paranoid or delusional. Thought content does not include homicidal or suicidal ideation. Thought content does not include homicidal or suicidal plan.        Cognition and Memory: Cognition and memory normal.        Judgment: Judgment normal.     Comments: Insight intact     Lab Review:     Component Value Date/Time   NA  136 10/18/2022 1732   K 3.9 10/18/2022 1732   CL 103 10/18/2022 1732   CO2 16 (L) 10/18/2022 1732   GLUCOSE 93 10/18/2022 1732   BUN 7 10/18/2022 1732   CREATININE 0.72 10/18/2022 1732   CALCIUM 9.7 10/18/2022 1732   PROT 7.7 10/18/2022 1732   ALBUMIN 4.8 10/18/2022 1732   AST 857 (H) 10/18/2022 1732   ALT 755 (H) 10/18/2022 1732   ALKPHOS 134 (H) 10/18/2022 1732   BILITOT 1.2 10/18/2022 1732   GFRNONAA >60 10/18/2022 1732   GFRAA >60 10/24/2018 0017       Component Value Date/Time   WBC 4.2 10/18/2022 1732   RBC 4.98 10/18/2022 1732   HGB 15.5 10/18/2022 1732   HCT 43.9 10/18/2022 1732   PLT 172 10/18/2022 1732   MCV 88.2 10/18/2022 1732   MCH 31.1 10/18/2022 1732   MCHC 35.3 10/18/2022 1732   RDW 14.0 10/18/2022 1732   LYMPHSABS 1.4 10/24/2018 0017   MONOABS 0.5 10/24/2018 0017   EOSABS 0.1 10/24/2018 0017   BASOSABS 0.0 10/24/2018 0017    No results found for: "POCLITH", "LITHIUM"   No results found for: "PHENYTOIN", "PHENOBARB", "VALPROATE", "CBMZ"   .res Assessment: Plan:    Plan:  Add Clonazepam 0.5mg  BID for increased anxiety in work setting  Zoloft 50mg  daily Adderall XR 30mg  daily.  RTC 4 weeks   Discussed potential benefits, risks, and side effects of stimulants with patient to include increased heart rate, palpitations, insomnia, increased anxiety, increased irritability, or decreased appetite.  Instructed patient to contact office if experiencing any significant tolerability issues.   There are no diagnoses linked to this encounter.   Please see After Visit Summary for patient specific instructions.  Future Appointments  Date Time Provider Department Center  11/12/2022  2:40 PM Carmelia Tiner, Thereasa Solo, NP CP-CP None    No orders of the defined types were placed in this encounter.   -------------------------------

## 2022-11-13 ENCOUNTER — Ambulatory Visit (HOSPITAL_COMMUNITY): Payer: BC Managed Care – PPO

## 2022-11-13 ENCOUNTER — Encounter (HOSPITAL_COMMUNITY): Payer: Self-pay

## 2022-12-10 ENCOUNTER — Ambulatory Visit: Payer: BC Managed Care – PPO | Admitting: Adult Health

## 2022-12-10 NOTE — Progress Notes (Signed)
Patient late for appointment. R/S.

## 2022-12-11 ENCOUNTER — Ambulatory Visit (INDEPENDENT_AMBULATORY_CARE_PROVIDER_SITE_OTHER): Payer: BC Managed Care – PPO | Admitting: Adult Health

## 2022-12-11 ENCOUNTER — Encounter: Payer: Self-pay | Admitting: Adult Health

## 2022-12-11 VITALS — BP 130/67 | HR 93

## 2022-12-11 DIAGNOSIS — F331 Major depressive disorder, recurrent, moderate: Secondary | ICD-10-CM | POA: Diagnosis not present

## 2022-12-11 DIAGNOSIS — F411 Generalized anxiety disorder: Secondary | ICD-10-CM | POA: Diagnosis not present

## 2022-12-11 DIAGNOSIS — F909 Attention-deficit hyperactivity disorder, unspecified type: Secondary | ICD-10-CM

## 2022-12-11 MED ORDER — SERTRALINE HCL 50 MG PO TABS
50.0000 mg | ORAL_TABLET | Freq: Every day | ORAL | 0 refills | Status: AC
Start: 2022-12-11 — End: ?

## 2022-12-11 MED ORDER — AMPHETAMINE-DEXTROAMPHET ER 30 MG PO CP24
30.0000 mg | ORAL_CAPSULE | ORAL | 0 refills | Status: AC
Start: 2022-12-11 — End: ?

## 2022-12-11 NOTE — Progress Notes (Signed)
Douglas Hawkins [id number] October 12, 1990 32 y.o.  Subjective:   Patient ID:  Douglas Hawkins is a 33 y.o. (DOB Apr 11, 1991) male.  Chief Complaint: No chief complaint on file.   HPI Douglas Hawkins presents to the office today for follow-up of GAD, MDD, and ADD.  Describes mood today as "ok". Pleasant. Denies tearfulness. Mood symptoms - denies depression. Reports anxiety and irritability. Reports social anxiety. Denies panic attacks. Denies some worry and rumination. Reports over thinking - how people perceive me". Denies obsessive thoughts and acts. Mood is consistent. Stating "I'm doing alright". Feels like medications are helpful. Improved interest and motivation. Taking medications as prescribed.  Energy levels improved. Active, does not have a regular exercise routine.  Enjoys some usual interests and activities. Single. Lives with parents - dog. Has an older sister in [place]. Spending time with family. Appetite adequate. Weight stable 248 pounds. Sleeps well most nights. Averages 8 hours. Focus and concentration improved with Adderall. Completing tasks. Managing aspects of household. Plans to start school. Denies SI or HI.  Denies AH or VH. Denies self harm.  Denies substance use.   Denies alcohol use x 8 weeks - Attends AA and NA meetings.  Previous medication trials: Adderall, Zoloft, Clonazepam, Concerta, Ritalin, Prozac, Buspar, Wellbutrin, and Effexor.   Flowsheet Row ED from 10/18/2022 in [facility] Emergency Department at [facility]  C-SSRS RISK CATEGORY No Risk        Review of Systems:  Review of Systems  Musculoskeletal:  Negative for gait problem.  Neurological:  Negative for tremors.  Psychiatric/Behavioral:         Please refer to HPI    Medications: I have reviewed the patient's current medications.  Current Outpatient Medications  Medication Sig Dispense Refill   amphetamine-dextroamphetamine (ADDERALL XR) 30 MG 24  hr capsule Take 1 capsule (30 mg total) by mouth every morning. 30 capsule 0   clonazePAM (KLONOPIN) 0.5 MG tablet Take 1 tablet (0.5 mg total) by mouth 2 (two) times daily as needed for anxiety. 60 tablet 0   Multiple Vitamin (MULTIVITAMIN) capsule Take 1 capsule by mouth daily.     sertraline (ZOLOFT) 50 MG tablet Take 1 tablet (50 mg total) by mouth daily. 30 tablet 0   No current facility-administered medications for this visit.    Medication Side Effects: None  Allergies: No Known Allergies  Past Medical History:  Diagnosis Date   ACL injury tear    left   ADHD (attention deficit hyperactivity disorder)     Past Medical History, Surgical history, Social history, and Family history were reviewed and updated as appropriate.   Please see review of systems for further details on the patient's review from today.   Objective:   Physical Exam:  There were no vitals taken for this visit.  Physical Exam Constitutional:      General: He is not in acute distress. Musculoskeletal:        General: No deformity.  Neurological:     Mental Status: He is alert and oriented to person, place, and time.     Coordination: Coordination normal.  Psychiatric:        Attention and Perception: Attention and perception normal. He does not perceive auditory or visual hallucinations.        Mood and Affect: Affect is not labile, blunt, angry or inappropriate.        Speech: Speech normal.        Behavior: Behavior normal.  Thought Content: Thought content normal. Thought content is not paranoid or delusional. Thought content does not include homicidal or suicidal ideation. Thought content does not include homicidal or suicidal plan.        Cognition and Memory: Cognition and memory normal.        Judgment: Judgment normal.     Comments: Insight intact     Lab Review:     Component Value Date/Time   NA 136 10/18/2022 1732   K 3.9 10/18/2022 1732   CL 103 10/18/2022 1732   CO2 16  (L) 10/18/2022 1732   GLUCOSE 93 10/18/2022 1732   BUN 7 10/18/2022 1732   CREATININE 0.72 10/18/2022 1732   CALCIUM 9.7 10/18/2022 1732   PROT 7.7 10/18/2022 1732   ALBUMIN 4.8 10/18/2022 1732   AST 857 (H) 10/18/2022 1732   ALT 755 (H) 10/18/2022 1732   ALKPHOS 134 (H) 10/18/2022 1732   BILITOT 1.2 10/18/2022 1732   GFRNONAA >60 10/18/2022 1732   GFRAA >60 10/24/2018 0017       Component Value Date/Time   WBC 4.2 10/18/2022 1732   RBC 4.98 10/18/2022 1732   HGB 15.5 10/18/2022 1732   HCT 43.9 10/18/2022 1732   PLT 172 10/18/2022 1732   MCV 88.2 10/18/2022 1732   MCH 31.1 10/18/2022 1732   MCHC 35.3 10/18/2022 1732   RDW 14.0 10/18/2022 1732   LYMPHSABS 1.4 10/24/2018 0017   MONOABS 0.5 10/24/2018 0017   EOSABS 0.1 10/24/2018 0017   BASOSABS 0.0 10/24/2018 0017    No results found for: "POCLITH", "LITHIUM"   No results found for: "PHENYTOIN", "PHENOBARB", "VALPROATE", "CBMZ"   .res Assessment: Plan:    Plan:  D/C Clonazepam 0.5mg  BID - takes infrequently.  Zoloft 50mg  daily Adderall XR 30mg  daily.  Also taking Naltrexone 50mg  daily  RTC 4 weeks   Discussed potential benefits, risks, and side effects of stimulants with patient to include increased heart rate, palpitations, insomnia, increased anxiety, increased irritability, or decreased appetite.  Instructed patient to contact office if experiencing any significant tolerability issues.   There are no diagnoses linked to this encounter.   Please see After Visit Summary for patient specific instructions.  Future Appointments  Date Time Provider Department Center  12/11/2022  3:40 PM Matrice Herro, Thereasa Solo, NP CP-CP None    No orders of the defined types were placed in this encounter.   -------------------------------

## 2022-12-15 ENCOUNTER — Encounter: Payer: Self-pay | Admitting: Urology

## 2022-12-15 ENCOUNTER — Encounter: Payer: Self-pay | Admitting: Diagnostic Radiology

## 2023-01-08 ENCOUNTER — Ambulatory Visit: Payer: BC Managed Care – PPO | Admitting: Adult Health

## 2023-01-21 ENCOUNTER — Ambulatory Visit (INDEPENDENT_AMBULATORY_CARE_PROVIDER_SITE_OTHER): Payer: Self-pay | Admitting: Adult Health

## 2023-01-21 DIAGNOSIS — F909 Attention-deficit hyperactivity disorder, unspecified type: Secondary | ICD-10-CM

## 2023-01-21 DIAGNOSIS — Z0389 Encounter for observation for other suspected diseases and conditions ruled out: Secondary | ICD-10-CM

## 2023-01-21 DIAGNOSIS — F411 Generalized anxiety disorder: Secondary | ICD-10-CM

## 2023-01-21 NOTE — Progress Notes (Deleted)
Douglas Hawkins [id number] March 11, 1991 32 y.o.  Subjective:   Patient ID:  Douglas Hawkins is a 32 y.o. (DOB 03-19-1991) male.  Chief Complaint: No chief complaint on file.   HPI Douglas Hawkins presents to the office today for follow-up of GAD, MDD, and ADD.  Describes mood today as "ok". Pleasant. Denies tearfulness. Mood symptoms - denies depression,   anxiety and irritability. Reports social anxiety - "situational". Denies panic attacks. Denies worry, rumination and over thinking. Denies obsessive thoughts and acts. Mood is consistent. Stating "I feel like I'm doing ok". Feels like medications are helpful. Improved interest and motivation. Taking medications as prescribed.  Energy levels improved. Active, does not have a regular exercise routine.  Enjoys some usual interests and activities. Single. Lives with parents - dog. Has an older sister in [place]. Spending time with family. Appetite adequate. Weight stable 248 pounds. Sleeps well most nights. Averages 8 hours. Focus and concentration improved with Adderall. Completing tasks. Managing aspects of household. Attending school - general education credits. Denies SI or HI.  Denies AH or VH. Denies self harm.  Denies substance use - denies alcohol x 1 month  Denies alcohol use x 8 weeks - Attends AA and NA meetings.  Previous medication trials: Adderall, Zoloft, Clonazepam, Concerta, Ritalin, Prozac, Buspar, Wellbutrin, and Effexor.   Flowsheet Row ED from 10/18/2022 in [facility] Emergency Department at [facility]  C-SSRS RISK CATEGORY No Risk        Review of Systems:  Review of Systems  Musculoskeletal:  Negative for gait problem.  Neurological:  Negative for tremors.  Psychiatric/Behavioral:         Please refer to HPI    Medications: I have reviewed the patient's current medications.  Current Outpatient Medications  Medication Sig Dispense Refill    amphetamine-dextroamphetamine (ADDERALL XR) 30 MG 24 hr capsule Take 1 capsule (30 mg total) by mouth every morning. 30 capsule 0   Multiple Vitamin (MULTIVITAMIN) capsule Take 1 capsule by mouth daily.     sertraline (ZOLOFT) 50 MG tablet Take 1 tablet (50 mg total) by mouth daily. 30 tablet 0   No current facility-administered medications for this visit.    Medication Side Effects: None  Allergies: No Known Allergies  Past Medical History:  Diagnosis Date   ACL injury tear    left   ADHD (attention deficit hyperactivity disorder)     Past Medical History, Surgical history, Social history, and Family history were reviewed and updated as appropriate.   Please see review of systems for further details on the patient's review from today.   Objective:   Physical Exam:  There were no vitals taken for this visit.  Physical Exam Constitutional:      General: He is not in acute distress. Musculoskeletal:        General: No deformity.  Neurological:     Mental Status: He is alert and oriented to person, place, and time.     Coordination: Coordination normal.  Psychiatric:        Attention and Perception: Attention and perception normal. He does not perceive auditory or visual hallucinations.        Mood and Affect: Affect is not labile, blunt, angry or inappropriate.        Speech: Speech normal.        Behavior: Behavior normal.        Thought Content: Thought content normal. Thought content is not paranoid or delusional. Thought content does not include homicidal  or suicidal ideation. Thought content does not include homicidal or suicidal plan.        Cognition and Memory: Cognition and memory normal.        Judgment: Judgment normal.     Comments: Insight intact     Lab Review:     Component Value Date/Time   NA 136 10/18/2022 1732   K 3.9 10/18/2022 1732   CL 103 10/18/2022 1732   CO2 16 (L) 10/18/2022 1732   GLUCOSE 93 10/18/2022 1732   BUN 7 10/18/2022 1732    CREATININE 0.72 10/18/2022 1732   CALCIUM 9.7 10/18/2022 1732   PROT 7.7 10/18/2022 1732   ALBUMIN 4.8 10/18/2022 1732   AST 857 (H) 10/18/2022 1732   ALT 755 (H) 10/18/2022 1732   ALKPHOS 134 (H) 10/18/2022 1732   BILITOT 1.2 10/18/2022 1732   GFRNONAA >60 10/18/2022 1732   GFRAA >60 10/24/2018 0017       Component Value Date/Time   WBC 4.2 10/18/2022 1732   RBC 4.98 10/18/2022 1732   HGB 15.5 10/18/2022 1732   HCT 43.9 10/18/2022 1732   PLT 172 10/18/2022 1732   MCV 88.2 10/18/2022 1732   MCH 31.1 10/18/2022 1732   MCHC 35.3 10/18/2022 1732   RDW 14.0 10/18/2022 1732   LYMPHSABS 1.4 10/24/2018 0017   MONOABS 0.5 10/24/2018 0017   EOSABS 0.1 10/24/2018 0017   BASOSABS 0.0 10/24/2018 0017    No results found for: "POCLITH", "LITHIUM"   No results found for: "PHENYTOIN", "PHENOBARB", "VALPROATE", "CBMZ"   .res Assessment: Plan:    Plan:  Zoloft 50mg  daily Adderall XR 30mg  daily.  Also taking Naltrexone 50mg  daily  124/79  RTC 4 weeks   Discussed potential benefits, risks, and side effects of stimulants with patient to include increased heart rate, palpitations, insomnia, increased anxiety, increased irritability, or decreased appetite.  Instructed patient to contact office if experiencing any significant tolerability issues.   Diagnoses and all orders for this visit:  Attention deficit hyperactivity disorder (ADHD), unspecified ADHD type -     amphetamine-dextroamphetamine (ADDERALL XR) 30 MG 24 hr capsule; Take 1 capsule (30 mg total) by mouth every morning.  Major depressive disorder, recurrent episode, moderate (HCC) -     sertraline (ZOLOFT) 50 MG tablet; Take 1 tablet (50 mg total) by mouth daily.  Generalized anxiety disorder -     sertraline (ZOLOFT) 50 MG tablet; Take 1 tablet (50 mg total) by mouth daily.  History of substance use     Please see After Visit Summary for patient specific instructions.  No future appointments.   No orders of  the defined types were placed in this encounter.   -------------------------------

## 2023-01-23 ENCOUNTER — Ambulatory Visit (INDEPENDENT_AMBULATORY_CARE_PROVIDER_SITE_OTHER): Payer: BC Managed Care – PPO | Admitting: Adult Health

## 2023-01-23 ENCOUNTER — Encounter: Payer: Self-pay | Admitting: Adult Health

## 2023-01-23 DIAGNOSIS — F909 Attention-deficit hyperactivity disorder, unspecified type: Secondary | ICD-10-CM

## 2023-01-23 DIAGNOSIS — F331 Major depressive disorder, recurrent, moderate: Secondary | ICD-10-CM | POA: Diagnosis not present

## 2023-01-23 DIAGNOSIS — Z87898 Personal history of other specified conditions: Secondary | ICD-10-CM | POA: Diagnosis not present

## 2023-01-23 DIAGNOSIS — F411 Generalized anxiety disorder: Secondary | ICD-10-CM

## 2023-01-23 DIAGNOSIS — Z0389 Encounter for observation for other suspected diseases and conditions ruled out: Secondary | ICD-10-CM

## 2023-01-23 MED ORDER — AMPHETAMINE-DEXTROAMPHET ER 30 MG PO CP24
30.0000 mg | ORAL_CAPSULE | ORAL | 0 refills | Status: DC
Start: 2023-01-23 — End: 2023-02-01

## 2023-01-23 MED ORDER — SERTRALINE HCL 50 MG PO TABS
50.0000 mg | ORAL_TABLET | Freq: Every day | ORAL | 0 refills | Status: DC
Start: 2023-01-23 — End: 2023-02-20

## 2023-01-23 NOTE — Addendum Note (Signed)
Addended by: Dorothyann Gibbs on: 01/23/2023 10:03 AM   Modules accepted: Level of Service

## 2023-01-23 NOTE — Addendum Note (Signed)
Addended by: Dorothyann Gibbs on: 01/23/2023 09:47 AM   Modules accepted: Orders, Level of Service

## 2023-01-23 NOTE — Progress Notes (Signed)
Patient no show appointment. ? ?

## 2023-01-23 NOTE — Progress Notes (Signed)
Douglas Hawkins [id number] [birth year] 32 y.o.  Subjective:   Patient ID:  Douglas Hawkins is a 32 y.o. (DOB Aug 14, 1990) male.  Chief Complaint: No chief complaint on file.   HPI Douglas Hawkins presents to the office today for follow-up of GAD, MDD, and ADD.  Describes mood today as "ok". Pleasant. Denies tearfulness. Mood symptoms - denies depression,   anxiety and irritability. Reports social anxiety - "situational". Denies panic attacks. Denies worry, rumination and over thinking. Denies obsessive thoughts and acts. Mood is consistent. Stating "I feel like I'm doing ok". Feels like medications are helpful. Improved interest and motivation. Taking medications as prescribed.  Energy levels improved. Active, does not have a regular exercise routine.  Enjoys some usual interests and activities. Single. Lives with parents - dog. Has an older sister in [place]. Spending time with family. Appetite adequate. Weight stable 248 pounds. Sleeps well most nights. Averages 8 hours. Focus and concentration improved with Adderall. Completing tasks. Managing aspects of household. Attending school - general education credits. Denies SI or HI.  Denies AH or VH. Denies self harm.  Denies substance use - denies alcohol x 1 month  Denies alcohol use x 8 weeks - Attends AA and NA meetings.  Previous medication trials: Adderall, Zoloft, Clonazepam, Concerta, Ritalin, Prozac, Buspar, Wellbutrin, and Effexor.   Flowsheet Row ED from 10/18/2022 in [facility] Emergency Department at [facility]  C-SSRS RISK CATEGORY No Risk        Review of Systems:  Review of Systems  Musculoskeletal:  Negative for gait problem.  Neurological:  Negative for tremors.  Psychiatric/Behavioral:         Please refer to HPI    Medications: I have reviewed the patient's current medications.  Current Outpatient Medications  Medication Sig Dispense Refill    amphetamine-dextroamphetamine (ADDERALL XR) 30 MG 24 hr capsule Take 1 capsule (30 mg total) by mouth every morning. 30 capsule 0   Multiple Vitamin (MULTIVITAMIN) capsule Take 1 capsule by mouth daily.     sertraline (ZOLOFT) 50 MG tablet Take 1 tablet (50 mg total) by mouth daily. 30 tablet 0   No current facility-administered medications for this visit.    Medication Side Effects: None  Allergies: No Known Allergies  Past Medical History:  Diagnosis Date   ACL injury tear    left   ADHD (attention deficit hyperactivity disorder)     Past Medical History, Surgical history, Social history, and Family history were reviewed and updated as appropriate.   Please see review of systems for further details on the patient's review from today.   Objective:   Physical Exam:  There were no vitals taken for this visit.  Physical Exam Constitutional:      General: He is not in acute distress. Musculoskeletal:        General: No deformity.  Neurological:     Mental Status: He is alert and oriented to person, place, and time.     Coordination: Coordination normal.  Psychiatric:        Attention and Perception: Attention and perception normal. He does not perceive auditory or visual hallucinations.        Mood and Affect: Affect is not labile, blunt, angry or inappropriate.        Speech: Speech normal.        Behavior: Behavior normal.        Thought Content: Thought content normal. Thought content is not paranoid or delusional. Thought content does not include homicidal  or suicidal ideation. Thought content does not include homicidal or suicidal plan.        Cognition and Memory: Cognition and memory normal.        Judgment: Judgment normal.     Comments: Insight intact     Lab Review:     Component Value Date/Time   NA 136 10/18/2022 1732   K 3.9 10/18/2022 1732   CL 103 10/18/2022 1732   CO2 16 (L) 10/18/2022 1732   GLUCOSE 93 10/18/2022 1732   BUN 7 10/18/2022 1732    CREATININE 0.72 10/18/2022 1732   CALCIUM 9.7 10/18/2022 1732   PROT 7.7 10/18/2022 1732   ALBUMIN 4.8 10/18/2022 1732   AST 857 (H) 10/18/2022 1732   ALT 755 (H) 10/18/2022 1732   ALKPHOS 134 (H) 10/18/2022 1732   BILITOT 1.2 10/18/2022 1732   GFRNONAA >60 10/18/2022 1732   GFRAA >60 10/24/2018 0017       Component Value Date/Time   WBC 4.2 10/18/2022 1732   RBC 4.98 10/18/2022 1732   HGB 15.5 10/18/2022 1732   HCT 43.9 10/18/2022 1732   PLT 172 10/18/2022 1732   MCV 88.2 10/18/2022 1732   MCH 31.1 10/18/2022 1732   MCHC 35.3 10/18/2022 1732   RDW 14.0 10/18/2022 1732   LYMPHSABS 1.4 10/24/2018 0017   MONOABS 0.5 10/24/2018 0017   EOSABS 0.1 10/24/2018 0017   BASOSABS 0.0 10/24/2018 0017    No results found for: "POCLITH", "LITHIUM"   No results found for: "PHENYTOIN", "PHENOBARB", "VALPROATE", "CBMZ"   .res Assessment: Plan:   Plan:  Zoloft 50mg  daily Adderall XR 30mg  daily.  Also taking Naltrexone 50mg  daily  124/79  RTC 4 weeks   Discussed potential benefits, risks, and side effects of stimulants with patient to include increased heart rate, palpitations, insomnia, increased anxiety, increased irritability, or decreased appetite.  Instructed patient to contact office if experiencing any significant tolerability issues.  Diagnoses and all orders for this visit:  Attention deficit hyperactivity disorder (ADHD), unspecified ADHD type  Major depressive disorder, recurrent episode, moderate (HCC)  Generalized anxiety disorder  History of substance use     Please see After Visit Summary for patient specific instructions.  Future Appointments  Date Time Provider Department Center  02/20/2023  1:40 PM Carmeline Kowal, Thereasa Solo, NP CP-CP None    No orders of the defined types were placed in this encounter.   -------------------------------

## 2023-01-30 ENCOUNTER — Telehealth: Payer: Self-pay | Admitting: Adult Health

## 2023-01-30 NOTE — Telephone Encounter (Signed)
Pt called at 3:45p to request refill of Adderall XR 30mg  to [facility].  Next appt 10/25

## 2023-01-31 ENCOUNTER — Other Ambulatory Visit (HOSPITAL_COMMUNITY): Payer: Self-pay

## 2023-02-01 ENCOUNTER — Other Ambulatory Visit: Payer: Self-pay

## 2023-02-01 DIAGNOSIS — F909 Attention-deficit hyperactivity disorder, unspecified type: Secondary | ICD-10-CM

## 2023-02-01 NOTE — Telephone Encounter (Signed)
Pended.

## 2023-02-03 ENCOUNTER — Other Ambulatory Visit (HOSPITAL_COMMUNITY): Payer: Self-pay

## 2023-02-03 MED ORDER — AMPHETAMINE-DEXTROAMPHET ER 30 MG PO CP24
30.0000 mg | ORAL_CAPSULE | ORAL | 0 refills | Status: DC
Start: 2023-02-03 — End: 2023-02-20
  Filled 2023-02-03: qty 30, 30d supply, fill #0

## 2023-02-20 ENCOUNTER — Other Ambulatory Visit: Payer: Self-pay | Admitting: Adult Health

## 2023-02-20 ENCOUNTER — Ambulatory Visit: Payer: BC Managed Care – PPO | Admitting: Adult Health

## 2023-02-20 ENCOUNTER — Other Ambulatory Visit (HOSPITAL_COMMUNITY): Payer: Self-pay

## 2023-02-20 DIAGNOSIS — Z0389 Encounter for observation for other suspected diseases and conditions ruled out: Secondary | ICD-10-CM

## 2023-02-20 DIAGNOSIS — F909 Attention-deficit hyperactivity disorder, unspecified type: Secondary | ICD-10-CM

## 2023-02-20 DIAGNOSIS — F411 Generalized anxiety disorder: Secondary | ICD-10-CM

## 2023-02-20 MED ORDER — SERTRALINE HCL 50 MG PO TABS
50.0000 mg | ORAL_TABLET | Freq: Every day | ORAL | 0 refills | Status: DC
  Filled 2023-02-20: qty 30, 30d supply, fill #0

## 2023-02-20 MED ORDER — AMPHETAMINE-DEXTROAMPHET ER 30 MG PO CP24
30.0000 mg | ORAL_CAPSULE | ORAL | 0 refills | Status: DC
  Filled 2023-02-20 – 2023-03-05 (×2): qty 30, 30d supply, fill #0

## 2023-02-20 NOTE — Progress Notes (Signed)
Patient will R/S appt. - pet emergency.

## 2023-03-05 ENCOUNTER — Other Ambulatory Visit (HOSPITAL_COMMUNITY): Payer: Self-pay

## 2023-03-11 ENCOUNTER — Encounter: Payer: Self-pay | Admitting: Psychiatry

## 2023-04-01 ENCOUNTER — Encounter: Payer: Self-pay | Admitting: Adult Health

## 2023-04-01 ENCOUNTER — Ambulatory Visit (INDEPENDENT_AMBULATORY_CARE_PROVIDER_SITE_OTHER): Payer: BC Managed Care – PPO | Admitting: Adult Health

## 2023-04-01 ENCOUNTER — Other Ambulatory Visit (HOSPITAL_COMMUNITY): Payer: Self-pay

## 2023-04-01 DIAGNOSIS — F411 Generalized anxiety disorder: Secondary | ICD-10-CM | POA: Diagnosis not present

## 2023-04-01 DIAGNOSIS — F909 Attention-deficit hyperactivity disorder, unspecified type: Secondary | ICD-10-CM | POA: Diagnosis not present

## 2023-04-01 MED ORDER — AMPHETAMINE-DEXTROAMPHETAMINE 10 MG PO TABS
10.0000 mg | ORAL_TABLET | Freq: Every day | ORAL | 0 refills | Status: DC
Start: 2023-04-01 — End: 2023-04-30
  Filled 2023-04-01: qty 22, 22d supply, fill #0
  Filled 2023-04-01: qty 10, 10d supply, fill #0
  Filled 2023-04-01: qty 8, 8d supply, fill #0

## 2023-04-01 MED ORDER — SERTRALINE HCL 50 MG PO TABS
50.0000 mg | ORAL_TABLET | Freq: Every day | ORAL | 1 refills | Status: DC
  Filled 2023-04-01: qty 90, 90d supply, fill #0

## 2023-04-01 MED ORDER — AMPHETAMINE-DEXTROAMPHET ER 30 MG PO CP24
30.0000 mg | ORAL_CAPSULE | ORAL | 0 refills | Status: DC
Start: 2023-04-01 — End: 2023-04-30
  Filled 2023-04-01: qty 30, 30d supply, fill #0

## 2023-04-01 NOTE — Progress Notes (Signed)
Douglas Hawkins [id number] 1991-03-11 32 y.o.  Subjective:   Patient ID:  Douglas Hawkins is a 32 y.o. (DOB 1990-11-07) male.  Chief Complaint: No chief complaint on file.   HPI Tell Douglas Hawkins presents to the office today for follow-up of GAD, MDD, and ADD.  Describes mood today as "ok". Pleasant. Denies tearfulness. Mood symptoms - denies depression,   anxiety and irritability. Reports social anxiety - "a little bit here and there". Denies panic attacks. Denies worry, rumination and over thinking. Denies obsessive thoughts and acts. Mood is consistent. Stating "I feel like I'm doing ok". Feels like medications are helpful. Improved interest and motivation. Taking medications as prescribed.  Energy levels "ok". Active, does not have a regular exercise routine.  Enjoys some usual interests and activities. Single. Lives with parents - dog. Has an older sister in [place]. Spending time with family. Appetite adequate. Weight stable 248 pounds. Sleeps well most nights. Averages 8 hours. Focus and concentration improved. Completing tasks. Managing aspects of household. Attending school - general education credits. Denies SI or HI.  Denies AH or VH. Denies self harm.  Denies substance use.  Attends AA and NA meetings.  Previous medication trials: Adderall, Zoloft, Clonazepam, Concerta, Ritalin, Prozac, Buspar, Wellbutrin, and Effexor.   Flowsheet Row ED from 10/18/2022 in [facility] Department at [facility]  C-SSRS RISK CATEGORY No Risk        Review of Systems:  Review of Systems  Musculoskeletal:  Negative for gait problem.  Neurological:  Negative for tremors.  Psychiatric/Behavioral:         Please refer to HPI    Medications: I have reviewed the patient's current medications.  Current Outpatient Medications  Medication Sig Dispense Refill   amphetamine-dextroamphetamine (ADDERALL) 10 MG tablet Take 1 tablet (10 mg total) by  mouth daily at 2 PM. 30 tablet 0   amphetamine-dextroamphetamine (ADDERALL XR) 30 MG 24 hr capsule Take 1 capsule (30 mg total) by mouth every morning. 30 capsule 0   Multiple Vitamin (MULTIVITAMIN) capsule Take 1 capsule by mouth daily.     sertraline (ZOLOFT) 50 MG tablet Take 1 tablet (50 mg total) by mouth daily. 90 tablet 1   No current facility-administered medications for this visit.    Medication Side Effects: None  Allergies: No Known Allergies  Past Medical History:  Diagnosis Date   ACL injury tear    left   ADHD (attention deficit hyperactivity disorder)     Past Medical History, Surgical history, Social history, and Family history were reviewed and updated as appropriate.   Please see review of systems for further details on the patient's review from today.   Objective:   Physical Exam:  There were no vitals taken for this visit.  Physical Exam  Lab Review:     Component Value Date/Time   NA 136 10/18/2022 1732   K 3.9 10/18/2022 1732   CL 103 10/18/2022 1732   CO2 16 (L) 10/18/2022 1732   GLUCOSE 93 10/18/2022 1732   BUN 7 10/18/2022 1732   CREATININE 0.72 10/18/2022 1732   CALCIUM 9.7 10/18/2022 1732   PROT 7.7 10/18/2022 1732   ALBUMIN 4.8 10/18/2022 1732   AST 857 (H) 10/18/2022 1732   ALT 755 (H) 10/18/2022 1732   ALKPHOS 134 (H) 10/18/2022 1732   BILITOT 1.2 10/18/2022 1732   GFRNONAA >60 10/18/2022 1732   GFRAA >60 10/24/2018 0017       Component Value Date/Time   WBC 4.2 10/18/2022  1732   RBC 4.98 10/18/2022 1732   HGB 15.5 10/18/2022 1732   HCT 43.9 10/18/2022 1732   PLT 172 10/18/2022 1732   MCV 88.2 10/18/2022 1732   MCH 31.1 10/18/2022 1732   MCHC 35.3 10/18/2022 1732   RDW 14.0 10/18/2022 1732   LYMPHSABS 1.4 10/24/2018 0017   MONOABS 0.5 10/24/2018 0017   EOSABS 0.1 10/24/2018 0017   BASOSABS 0.0 10/24/2018 0017    No results found for: "POCLITH", "LITHIUM"   No results found for: "PHENYTOIN", "PHENOBARB", "VALPROATE",  "CBMZ"   .res Assessment: Plan:    Plan:  Add Adderall 10mg  in the afternoons for class work.  Zoloft 50mg  daily Adderall XR 30mg  daily.  Monitor BP between visits while taking stimulant medication.   RTC 4 weeks   Discussed potential benefits, risks, and side effects of stimulants with patient to include increased heart rate, palpitations, insomnia, increased anxiety, increased irritability, or decreased appetite.  Instructed patient to contact office if experiencing any significant tolerability issues.   Diagnoses and all orders for this visit:  Attention deficit hyperactivity disorder (ADHD), unspecified ADHD type -     amphetamine-dextroamphetamine (ADDERALL XR) 30 MG 24 hr capsule; Take 1 capsule (30 mg total) by mouth every morning. -     amphetamine-dextroamphetamine (ADDERALL) 10 MG tablet; Take 1 tablet (10 mg total) by mouth daily at 2 PM.  Generalized anxiety disorder -     sertraline (ZOLOFT) 50 MG tablet; Take 1 tablet (50 mg total) by mouth daily.     Please see After Visit Summary for patient specific instructions.  Future Appointments  Date Time Provider Department Center  04/30/2023  4:40 PM Allysen Lazo, Thereasa Solo, NP [facility] None    No orders of the defined types were placed in this encounter.   -------------------------------

## 2023-04-23 ENCOUNTER — Other Ambulatory Visit: Payer: Self-pay | Admitting: Nurse Practitioner

## 2023-04-23 DIAGNOSIS — K7010 Alcoholic hepatitis without ascites: Secondary | ICD-10-CM

## 2023-04-23 DIAGNOSIS — K760 Fatty (change of) liver, not elsewhere classified: Secondary | ICD-10-CM

## 2023-04-23 DIAGNOSIS — F1010 Alcohol abuse, uncomplicated: Secondary | ICD-10-CM

## 2023-04-24 ENCOUNTER — Ambulatory Visit
Admission: RE | Admit: 2023-04-24 | Discharge: 2023-04-24 | Disposition: A | Discharge status: Home / Self Care | Payer: BC Managed Care – PPO | Source: Ambulatory Visit | Attending: Nurse Practitioner

## 2023-04-24 DIAGNOSIS — F1010 Alcohol abuse, uncomplicated: Secondary | ICD-10-CM

## 2023-04-24 DIAGNOSIS — K7010 Alcoholic hepatitis without ascites: Secondary | ICD-10-CM

## 2023-04-24 DIAGNOSIS — K760 Fatty (change of) liver, not elsewhere classified: Secondary | ICD-10-CM

## 2023-04-30 ENCOUNTER — Ambulatory Visit: Payer: BC Managed Care – PPO | Admitting: Adult Health

## 2023-04-30 ENCOUNTER — Other Ambulatory Visit (HOSPITAL_BASED_OUTPATIENT_CLINIC_OR_DEPARTMENT_OTHER): Payer: Self-pay

## 2023-04-30 ENCOUNTER — Other Ambulatory Visit (HOSPITAL_COMMUNITY): Payer: Self-pay

## 2023-04-30 ENCOUNTER — Encounter: Payer: Self-pay | Admitting: Adult Health

## 2023-04-30 DIAGNOSIS — F411 Generalized anxiety disorder: Secondary | ICD-10-CM

## 2023-04-30 DIAGNOSIS — F909 Attention-deficit hyperactivity disorder, unspecified type: Secondary | ICD-10-CM

## 2023-04-30 MED ORDER — AMPHETAMINE-DEXTROAMPHETAMINE 10 MG PO TABS
10.0000 mg | ORAL_TABLET | Freq: Every day | ORAL | 0 refills | Status: DC
Start: 2023-04-30 — End: 2023-05-28
  Filled 2023-04-30: qty 30, 30d supply, fill #0

## 2023-04-30 MED ORDER — SERTRALINE HCL 50 MG PO TABS
50.0000 mg | ORAL_TABLET | Freq: Every day | ORAL | 1 refills | Status: DC
  Filled 2023-04-30 – 2023-07-27 (×3): qty 90, 90d supply, fill #0

## 2023-04-30 MED ORDER — AMPHETAMINE-DEXTROAMPHET ER 30 MG PO CP24
30.0000 mg | ORAL_CAPSULE | ORAL | 0 refills | Status: DC
Start: 2023-04-30 — End: 2023-05-28
  Filled 2023-04-30: qty 30, 30d supply, fill #0

## 2023-04-30 NOTE — Progress Notes (Signed)
 Douglas Hawkins [id number] 06-17-1990 32 y.o.  Subjective:   Patient ID:  Douglas Hawkins is a 33 y.o. (DOB 12/12/1990) male.  Chief Complaint: No chief complaint on file.   HPI Douglas Hawkins presents to the office today for follow-up of GAD, MDD, and ADD.  Describes mood today as ok. Pleasant. Denies tearfulness. Mood symptoms - denies depression,    and irritability. Reports some anxiety related to school. Reports social anxiety - being out around people. Denies panic attacks. Denies worry, rumination and over thinking. Denies obsessive thoughts and acts. Mood is stable. Stating I feel like I'm doing alright. Feels like medications are helpful. Stable interest and motivation. Taking medications as prescribed.  Energy levels ok. Active, does not have a regular exercise routine with current injury.  Enjoys some usual interests and activities. Single. Lives with parents - dog. Has an older sister in [place]. Spending time with family. Appetite adequate. Weight stable 248 pounds. Sleeps well most nights. Averages 8 hours. Focus and concentration improved. Completing tasks. Managing aspects of household. Attending school - general education credits. Denies SI or HI.  Denies AH or VH. Denies self harm.  Denies substance use.  Attends AA and NA meetings.  Previous medication trials: Adderall, Zoloft , Clonazepam , Concerta, Ritalin, Prozac, Buspar, Wellbutrin, and Effexor.    Flowsheet Row ED from 10/18/2022 in [facility] Emergency Department at [facility]  C-SSRS RISK CATEGORY No Risk        Review of Systems:  Review of Systems  Musculoskeletal:  Negative for gait problem.  Neurological:  Negative for tremors.  Psychiatric/Behavioral:         Please refer to HPI    Medications: I have reviewed the patient's current medications.  Current Outpatient Medications  Medication Sig Dispense Refill   amphetamine -dextroamphetamine   (ADDERALL XR) 30 MG 24 hr capsule Take 1 capsule (30 mg total) by mouth every morning. 30 capsule 0   amphetamine -dextroamphetamine  (ADDERALL) 10 MG tablet Take 1 tablet (10 mg total) by mouth daily at 2 PM. 30 tablet 0   Multiple Vitamin (MULTIVITAMIN) capsule Take 1 capsule by mouth daily.     sertraline  (ZOLOFT ) 50 MG tablet Take 1 tablet (50 mg total) by mouth daily. 90 tablet 1   No current facility-administered medications for this visit.    Medication Side Effects: None  Allergies: No Known Allergies  Past Medical History:  Diagnosis Date   ACL injury tear    left   ADHD (attention deficit hyperactivity disorder)     Past Medical History, Surgical history, Social history, and Family history were reviewed and updated as appropriate.   Please see review of systems for further details on the patient's review from today.   Objective:   Physical Exam:  There were no vitals taken for this visit.  Physical Exam Constitutional:      General: He is not in acute distress. Musculoskeletal:        General: No deformity.  Neurological:     Mental Status: He is alert and oriented to person, place, and time.     Coordination: Coordination normal.  Psychiatric:        Attention and Perception: Attention and perception normal. He does not perceive auditory or visual hallucinations.        Mood and Affect: Mood normal. Mood is not anxious or depressed. Affect is not labile, blunt, angry or inappropriate.        Speech: Speech normal.  Behavior: Behavior normal.        Thought Content: Thought content normal. Thought content is not paranoid or delusional. Thought content does not include homicidal or suicidal ideation. Thought content does not include homicidal or suicidal plan.        Cognition and Memory: Cognition and memory normal.        Judgment: Judgment normal.     Comments: Insight intact     Lab Review:     Component Value Date/Time   NA 136 10/18/2022 1732   K  3.9 10/18/2022 1732   CL 103 10/18/2022 1732   CO2 16 (L) 10/18/2022 1732   GLUCOSE 93 10/18/2022 1732   BUN 7 10/18/2022 1732   CREATININE 0.72 10/18/2022 1732   CALCIUM 9.7 10/18/2022 1732   PROT 7.7 10/18/2022 1732   ALBUMIN 4.8 10/18/2022 1732   AST 857 (H) 10/18/2022 1732   ALT 755 (H) 10/18/2022 1732   ALKPHOS 134 (H) 10/18/2022 1732   BILITOT 1.2 10/18/2022 1732   GFRNONAA >60 10/18/2022 1732   GFRAA >60 10/24/2018 0017       Component Value Date/Time   WBC 4.2 10/18/2022 1732   RBC 4.98 10/18/2022 1732   HGB 15.5 10/18/2022 1732   HCT 43.9 10/18/2022 1732   PLT 172 10/18/2022 1732   MCV 88.2 10/18/2022 1732   MCH 31.1 10/18/2022 1732   MCHC 35.3 10/18/2022 1732   RDW 14.0 10/18/2022 1732   LYMPHSABS 1.4 10/24/2018 0017   MONOABS 0.5 10/24/2018 0017   EOSABS 0.1 10/24/2018 0017   BASOSABS 0.0 10/24/2018 0017    No results found for: POCLITH, LITHIUM   No results found for: PHENYTOIN, PHENOBARB, VALPROATE, CBMZ   .res Assessment: Plan:    Plan:  Adderall 10mg  in the afternoons for class work. Zoloft  50mg  daily Adderall XR 30mg  daily.  Monitor BP between visits while taking stimulant medication.   RTC 4 weeks   Discussed potential benefits, risks, and side effects of stimulants with patient to include increased heart rate, palpitations, insomnia, increased anxiety, increased irritability, or decreased appetite.  Instructed patient to contact office if experiencing any significant tolerability issues.   Diagnoses and all orders for this visit:  Attention deficit hyperactivity disorder (ADHD), unspecified ADHD type -     amphetamine -dextroamphetamine  (ADDERALL XR) 30 MG 24 hr capsule; Take 1 capsule (30 mg total) by mouth every morning. -     amphetamine -dextroamphetamine  (ADDERALL) 10 MG tablet; Take 1 tablet (10 mg total) by mouth daily at 2 PM.  Generalized anxiety disorder -     sertraline  (ZOLOFT ) 50 MG tablet; Take 1 tablet (50 mg  total) by mouth daily.     Please see After Visit Summary for patient specific instructions.  No future appointments.   No orders of the defined types were placed in this encounter.   -------------------------------

## 2023-05-28 ENCOUNTER — Telehealth (INDEPENDENT_AMBULATORY_CARE_PROVIDER_SITE_OTHER): Payer: BC Managed Care – PPO | Admitting: Adult Health

## 2023-05-28 ENCOUNTER — Encounter: Payer: Self-pay | Admitting: Adult Health

## 2023-05-28 ENCOUNTER — Other Ambulatory Visit (HOSPITAL_COMMUNITY): Payer: Self-pay

## 2023-05-28 DIAGNOSIS — F411 Generalized anxiety disorder: Secondary | ICD-10-CM

## 2023-05-28 DIAGNOSIS — F909 Attention-deficit hyperactivity disorder, unspecified type: Secondary | ICD-10-CM | POA: Diagnosis not present

## 2023-05-28 MED ORDER — AMPHETAMINE-DEXTROAMPHETAMINE 10 MG PO TABS
10.0000 mg | ORAL_TABLET | Freq: Every day | ORAL | 0 refills | Status: DC
  Filled 2023-05-28: qty 30, 30d supply, fill #0

## 2023-05-28 MED ORDER — AMPHETAMINE-DEXTROAMPHET ER 30 MG PO CP24
30.0000 mg | ORAL_CAPSULE | ORAL | 0 refills | Status: DC
  Filled 2023-05-28: qty 30, 30d supply, fill #0

## 2023-05-28 NOTE — Progress Notes (Signed)
Douglas Hawkins [id number] 1991-01-31 33 y.o.  Virtual Visit via Video Note  I connected with pt @ on 05/28/23 at 10:30 AM EST by a video enabled telemedicine application and verified that I am speaking with the correct person using two identifiers.   I discussed the limitations of evaluation and management by telemedicine and the availability of in person appointments. The patient expressed understanding and agreed to proceed.  I discussed the assessment and treatment plan with the patient. The patient was provided an opportunity to ask questions and all were answered. The patient agreed with the plan and demonstrated an understanding of the instructions.   The patient was advised to call back or seek an in-person evaluation if the symptoms worsen or if the condition fails to improve as anticipated.  I provided 15 minutes of non-face-to-face time during this encounter.  The patient was located at home.  The provider was located at [facility].   Dorothyann Gibbs, NP   Subjective:   Patient ID:  Douglas Hawkins is a 33 y.o. (DOB 02-Apr-1991) male.  Chief Complaint: No chief complaint on file.   HPI [name] presents for follow-up of GAD, MDD, and ADD.  Describes mood today as "ok". Pleasant. Denies tearfulness. Mood symptoms - denies depression,    and irritability. Reports decreased surrounding  school. Reports social anxiety. Denies panic attacks. Denies worry, rumination and over thinking. Denies obsessive thoughts and acts. Mood is consistent. Stating "I feel like I'm doing ok". Feels like medications are helpful. Stable interest and motivation. Taking medications as prescribed.  Energy levels lower with recent illness. Active, does not have a regular exercise routine with current injury.  Enjoys some usual interests and activities. Single. Lives with parents - dog. Has an older sister in [place]. Spending time with family. Appetite  decreased. Weight loss - 230 from 248 pounds. Sleeps well most nights. Averages 7 to 8 hours. Focus and concentration improved the Adderall. Completing tasks. Managing aspects of household. Attending school - general education credits. Denies SI or HI.  Denies AH or VH. Denies self harm.  Denies substance use.  Attends AA and NA meetings.  Previous medication trials: Adderall, Zoloft, Clonazepam, Concerta, Ritalin, Prozac, Buspar, Wellbutrin, and Effexor.    Review of Systems:  Review of Systems  Medications: I have reviewed the patient's current medications.  Current Outpatient Medications  Medication Sig Dispense Refill   amphetamine-dextroamphetamine (ADDERALL XR) 30 MG 24 hr capsule Take 1 capsule (30 mg total) by mouth every morning. 30 capsule 0   amphetamine-dextroamphetamine (ADDERALL) 10 MG tablet Take 1 tablet (10 mg total) by mouth daily at 2 PM. 30 tablet 0   Multiple Vitamin (MULTIVITAMIN) capsule Take 1 capsule by mouth daily.     sertraline (ZOLOFT) 50 MG tablet Take 1 tablet (50 mg total) by mouth daily. 90 tablet 1   No current facility-administered medications for this visit.    Medication Side Effects: None  Allergies: No Known Allergies  Past Medical History:  Diagnosis Date   ACL injury tear    left   ADHD (attention deficit hyperactivity disorder)     Family History  Problem Relation Age of Onset   ADD / ADHD Mother    Hypertension Father    Diabetes Father    Diabetes Paternal Grandfather    Suicidality Cousin    Bipolar disorder Cousin     Social History   Socioeconomic History   Marital status: Single    Spouse name:  Not on file   Number of children: Not on file   Years of education: Not on file   Highest education level: Not on file  Occupational History   Not on file  Tobacco Use   Smoking status: Some Days    Types: Cigarettes   Smokeless tobacco: Never  Vaping Use   Vaping status: Every Day  Substance and Sexual Activity    Alcohol use: Yes    Comment: fifth/day   Drug use: Not Currently    Comment: last IVD use 12/2020   Sexual activity: Not on file  Other Topics Concern   Not on file  Social History Narrative   Not on file   Social Drivers of Health   Financial Resource Strain: Not on file  Food Insecurity: Low Risk  (10/24/2022)   [facility]   Hunger Vital Sign    Worried About Running Out of Food in the Last Year: Never true    Ran Out of Food in the Last Year: Never true  Transportation Needs: Not on file (10/24/2022)  Physical Activity: Not on file  Stress: Not on file  Social Connections: Not on file  Intimate Partner Violence: Not on file    Past Medical History, Surgical history, Social history, and Family history were reviewed and updated as appropriate.   Please see review of systems for further details on the patient's review from today.   Objective:   Physical Exam:  There were no vitals taken for this visit.  Physical Exam  Lab Review:     Component Value Date/Time   NA 136 10/18/2022 1732   K 3.9 10/18/2022 1732   CL 103 10/18/2022 1732   CO2 16 (L) 10/18/2022 1732   GLUCOSE 93 10/18/2022 1732   BUN 7 10/18/2022 1732   CREATININE 0.72 10/18/2022 1732   CALCIUM 9.7 10/18/2022 1732   PROT 7.7 10/18/2022 1732   ALBUMIN 4.8 10/18/2022 1732   AST 857 (H) 10/18/2022 1732   ALT 755 (H) 10/18/2022 1732   ALKPHOS 134 (H) 10/18/2022 1732   BILITOT 1.2 10/18/2022 1732   GFRNONAA >60 10/18/2022 1732   GFRAA >60 10/24/2018 0017       Component Value Date/Time   WBC 4.2 10/18/2022 1732   RBC 4.98 10/18/2022 1732   HGB 15.5 10/18/2022 1732   HCT 43.9 10/18/2022 1732   PLT 172 10/18/2022 1732   MCV 88.2 10/18/2022 1732   MCH 31.1 10/18/2022 1732   MCHC 35.3 10/18/2022 1732   RDW 14.0 10/18/2022 1732   LYMPHSABS 1.4 10/24/2018 0017   MONOABS 0.5 10/24/2018 0017   EOSABS 0.1 10/24/2018 0017   BASOSABS 0.0 10/24/2018 0017    No results  found for: "POCLITH", "LITHIUM"   No results found for: "PHENYTOIN", "PHENOBARB", "VALPROATE", "CBMZ"   .res Assessment: Plan:    Plan:  Adderall 10mg  in the afternoons for class work. Zoloft 50mg  daily Adderall XR 30mg  daily.  15 minutes spent dedicated to the care of this patient on the date of this encounter to include pre-visit review of records, ordering of medication, post visit documentation, and face-to-face time with the patient discussing ADHD and GAD. Discussed continuing current medication regimen.  Monitor BP between visits while taking stimulant medication.   RTC 4 weeks   Discussed potential benefits, risks, and side effects of stimulants with patient to include increased heart rate, palpitations, insomnia, increased anxiety, increased irritability, or decreased appetite.  Instructed patient to contact office if experiencing  any significant tolerability issues.   Diagnoses and all orders for this visit:  Attention deficit hyperactivity disorder (ADHD), unspecified ADHD type -     amphetamine-dextroamphetamine (ADDERALL XR) 30 MG 24 hr capsule; Take 1 capsule (30 mg total) by mouth every morning. -     amphetamine-dextroamphetamine (ADDERALL) 10 MG tablet; Take 1 tablet (10 mg total) by mouth daily at 2 PM.     Please see After Visit Summary for patient specific instructions.  No future appointments.   No orders of the defined types were placed in this encounter.     -------------------------------

## 2023-06-23 ENCOUNTER — Encounter: Payer: Self-pay | Admitting: Adult Health

## 2023-06-23 ENCOUNTER — Telehealth: Payer: BC Managed Care – PPO | Admitting: Adult Health

## 2023-06-23 DIAGNOSIS — F909 Attention-deficit hyperactivity disorder, unspecified type: Secondary | ICD-10-CM

## 2023-06-23 DIAGNOSIS — F411 Generalized anxiety disorder: Secondary | ICD-10-CM | POA: Diagnosis not present

## 2023-06-23 DIAGNOSIS — F331 Major depressive disorder, recurrent, moderate: Secondary | ICD-10-CM

## 2023-06-23 MED ORDER — AMPHETAMINE-DEXTROAMPHETAMINE 10 MG PO TABS
10.0000 mg | ORAL_TABLET | Freq: Every day | ORAL | 0 refills | Status: DC
Start: 2023-06-23 — End: 2023-07-23
  Filled 2023-06-23 – 2023-06-25 (×2): qty 30, 30d supply, fill #0

## 2023-06-23 MED ORDER — AMPHETAMINE-DEXTROAMPHET ER 30 MG PO CP24
30.0000 mg | ORAL_CAPSULE | ORAL | 0 refills | Status: DC
  Filled 2023-06-23 – 2023-06-26 (×3): qty 30, 30d supply, fill #0

## 2023-06-23 NOTE — Progress Notes (Signed)
 Douglas Hawkins [id number] [birth year] 33 y.o.  Virtual Visit via Video Note  I connected with pt @ on 06/23/23 at  5:30 PM EST by a video enabled telemedicine application and verified that I am speaking with the correct person using two identifiers.   I discussed the limitations of evaluation and management by telemedicine and the availability of in person appointments. The patient expressed understanding and agreed to proceed.  I discussed the assessment and treatment plan with the patient. The patient was provided an opportunity to ask questions and all were answered. The patient agreed with the plan and demonstrated an understanding of the instructions.   The patient was advised to call back or seek an in-person evaluation if the symptoms worsen or if the condition fails to improve as anticipated.  I provided 15 minutes of non-face-to-face time during this encounter.  The patient was located at home.  The provider was located at [facility].   Dorothyann Gibbs, NP   Subjective:   Patient ID:  Douglas Hawkins is a 33 y.o. (DOB 11/20/1990) male.  Chief Complaint: No chief complaint on file.   HPI Tucker Rodriques Badie presents for follow-up of GAD, MDD, and ADD.  Describes mood today as "ok". Pleasant. Denies tearfulness. Mood symptoms - denies depression,    and irritability. Reports stable interest and motivation. Reports some anxiety. Reports social anxiety. Denies panic attacks. Denies worry, rumination and over thinking. Denies obsessive thoughts and acts. Mood is consistent. Stating "I feel like I'm doing alright. Feels like medications are helpful. Taking medications as prescribed.  Energy levels improved. Active, exercising some days.  Enjoys some usual interests and activities. Single. Lives with parents - dog. Has an older sister in [place]. Spending time with family. Appetite adequate. Weight loss - 35 pounds 220 from 258 pounds - low carb  diet.  Sleeps well most nights. Averages 7 hours. Focus and concentration stable with Adderall. Completing tasks. Managing aspects of household. Attending school 3 classes - general education credits. Denies SI or HI.  Denies AH or VH. Denies self harm.  Denies substance use. Attends AA and NA meetings.  Previous medication trials: Adderall, Zoloft, Clonazepam, Concerta, Ritalin, Prozac, Buspar, Wellbutrin, and Effexor.  Review of Systems:  Review of Systems  Musculoskeletal:  Negative for gait problem.  Neurological:  Negative for tremors.  Psychiatric/Behavioral:         Please refer to HPI    Medications: I have reviewed the patient's current medications.  Current Outpatient Medications  Medication Sig Dispense Refill   amphetamine-dextroamphetamine (ADDERALL XR) 30 MG 24 hr capsule Take 1 capsule (30 mg total) by mouth every morning. 30 capsule 0   amphetamine-dextroamphetamine (ADDERALL) 10 MG tablet Take 1 tablet (10 mg total) by mouth daily at 2 PM. 30 tablet 0   Multiple Vitamin (MULTIVITAMIN) capsule Take 1 capsule by mouth daily.     sertraline (ZOLOFT) 50 MG tablet Take 1 tablet (50 mg total) by mouth daily. 90 tablet 1   No current facility-administered medications for this visit.    Medication Side Effects: None  Allergies: No Known Allergies  Past Medical History:  Diagnosis Date   ACL injury tear    left   ADHD (attention deficit hyperactivity disorder)     Family History  Problem Relation Age of Onset   ADD / ADHD Mother    Hypertension Father    Diabetes Father    Diabetes Paternal Grandfather    Suicidality Cousin  Bipolar disorder Cousin     Social History   Socioeconomic History   Marital status: Single    Spouse name: Not on file   Number of children: Not on file   Years of education: Not on file   Highest education level: Not on file  Occupational History   Not on file  Tobacco Use   Smoking status: Some Days    Types: Cigarettes    Smokeless tobacco: Never  Vaping Use   Vaping status: Every Day  Substance and Sexual Activity   Alcohol use: Yes    Comment: fifth/day   Drug use: Not Currently    Comment: last IVD use 12/2020   Sexual activity: Not on file  Other Topics Concern   Not on file  Social History Narrative   Not on file   Social Drivers of Health   Financial Resource Strain: Not on file  Food Insecurity: Low Risk  (10/24/2022)   [facility]   Hunger Vital Sign    Worried About Running Out of Food in the Last Year: Never true    Ran Out of Food in the Last Year: Never true  Transportation Needs: Not on file (10/24/2022)  Physical Activity: Not on file  Stress: Not on file  Social Connections: Not on file  Intimate Partner Violence: Not on file    Past Medical History, Surgical history, Social history, and Family history were reviewed and updated as appropriate.   Please see review of systems for further details on the patient's review from today.   Objective:   Physical Exam:  There were no vitals taken for this visit.  Physical Exam Constitutional:      General: He is not in acute distress. Musculoskeletal:        General: No deformity.  Neurological:     Mental Status: He is alert and oriented to person, place, and time.     Coordination: Coordination normal.  Psychiatric:        Attention and Perception: Attention and perception normal. He does not perceive auditory or visual hallucinations.        Mood and Affect: Affect is not labile, blunt, angry or inappropriate.        Speech: Speech normal.        Behavior: Behavior normal.        Thought Content: Thought content normal. Thought content is not paranoid or delusional. Thought content does not include homicidal or suicidal ideation. Thought content does not include homicidal or suicidal plan.        Cognition and Memory: Cognition and memory normal.        Judgment: Judgment normal.     Comments:  Insight intact     Lab Review:     Component Value Date/Time   NA 136 10/18/2022 1732   K 3.9 10/18/2022 1732   CL 103 10/18/2022 1732   CO2 16 (L) 10/18/2022 1732   GLUCOSE 93 10/18/2022 1732   BUN 7 10/18/2022 1732   CREATININE 0.72 10/18/2022 1732   CALCIUM 9.7 10/18/2022 1732   PROT 7.7 10/18/2022 1732   ALBUMIN 4.8 10/18/2022 1732   AST 857 (H) 10/18/2022 1732   ALT 755 (H) 10/18/2022 1732   ALKPHOS 134 (H) 10/18/2022 1732   BILITOT 1.2 10/18/2022 1732   GFRNONAA >60 10/18/2022 1732   GFRAA >60 10/24/2018 0017       Component Value Date/Time   WBC 4.2 10/18/2022 1732   RBC 4.98  10/18/2022 1732   HGB 15.5 10/18/2022 1732   HCT 43.9 10/18/2022 1732   PLT 172 10/18/2022 1732   MCV 88.2 10/18/2022 1732   MCH 31.1 10/18/2022 1732   MCHC 35.3 10/18/2022 1732   RDW 14.0 10/18/2022 1732   LYMPHSABS 1.4 10/24/2018 0017   MONOABS 0.5 10/24/2018 0017   EOSABS 0.1 10/24/2018 0017   BASOSABS 0.0 10/24/2018 0017    No results found for: "POCLITH", "LITHIUM"   No results found for: "PHENYTOIN", "PHENOBARB", "VALPROATE", "CBMZ"   .res Assessment: Plan:    Plan:  Adderall 10mg  in the afternoons for class work. Zoloft 50mg  daily Adderall XR 30mg  daily.  15 minutes spent dedicated to the care of this patient on the date of this encounter to include pre-visit review of records, ordering of medication, post visit documentation, and face-to-face time with the patient discussing ADHD and GAD. Discussed continuing current medication regimen.  Monitor BP between visits while taking stimulant medication.   Discussed potential benefits, risks, and side effects of stimulants with patient to include increased heart rate, palpitations, insomnia, increased anxiety, increased irritability, or decreased appetite.  Instructed patient to contact office if experiencing any significant tolerability issues.   Diagnoses and all orders for this visit:  Attention deficit hyperactivity  disorder (ADHD), unspecified ADHD type  Generalized anxiety disorder  Major depressive disorder, recurrent episode, moderate (HCC)     Please see After Visit Summary for patient specific instructions.  Future Appointments  Date Time Provider Department Center  06/23/2023  5:30 PM Nyoka Alcoser, Thereasa Solo, NP [facility] None    No orders of the defined types were placed in this encounter.     -------------------------------

## 2023-06-24 ENCOUNTER — Other Ambulatory Visit (HOSPITAL_COMMUNITY): Payer: Self-pay

## 2023-06-25 ENCOUNTER — Other Ambulatory Visit: Payer: Self-pay

## 2023-06-25 ENCOUNTER — Other Ambulatory Visit (HOSPITAL_COMMUNITY): Payer: Self-pay

## 2023-06-26 ENCOUNTER — Other Ambulatory Visit (HOSPITAL_COMMUNITY): Payer: Self-pay

## 2023-07-23 ENCOUNTER — Telehealth: Admitting: Adult Health

## 2023-07-23 ENCOUNTER — Encounter: Payer: Self-pay | Admitting: Adult Health

## 2023-07-23 ENCOUNTER — Other Ambulatory Visit (HOSPITAL_COMMUNITY): Payer: Self-pay

## 2023-07-23 DIAGNOSIS — F909 Attention-deficit hyperactivity disorder, unspecified type: Secondary | ICD-10-CM | POA: Diagnosis not present

## 2023-07-23 DIAGNOSIS — F411 Generalized anxiety disorder: Secondary | ICD-10-CM

## 2023-07-23 DIAGNOSIS — F331 Major depressive disorder, recurrent, moderate: Secondary | ICD-10-CM | POA: Diagnosis not present

## 2023-07-23 MED ORDER — AMPHETAMINE-DEXTROAMPHETAMINE 10 MG PO TABS
10.0000 mg | ORAL_TABLET | Freq: Every day | ORAL | 0 refills | Status: DC
  Filled 2023-07-23: qty 30, 30d supply, fill #0

## 2023-07-23 MED ORDER — AMPHETAMINE-DEXTROAMPHET ER 30 MG PO CP24
30.0000 mg | ORAL_CAPSULE | ORAL | 0 refills | Status: DC
  Filled 2023-07-23 – 2023-07-27 (×3): qty 30, 30d supply, fill #0

## 2023-07-23 NOTE — Progress Notes (Signed)
 Douglas Hawkins [id number] [birth year] 33 y.o.  Virtual Visit via Video Note  I connected with pt @ on 07/23/23 at  2:30 PM EDT by a video enabled telemedicine application and verified that I am speaking with the correct person using two identifiers.   I discussed the limitations of evaluation and management by telemedicine and the availability of in person appointments. The patient expressed understanding and agreed to proceed.  I discussed the assessment and treatment plan with the patient. The patient was provided an opportunity to ask questions and all were answered. The patient agreed with the plan and demonstrated an understanding of the instructions.   The patient was advised to call back or seek an in-person evaluation if the symptoms worsen or if the condition fails to improve as anticipated.  I provided 20 minutes of non-face-to-face time during this encounter.  The patient was located at home.  The provider was located at [facility].   Dorothyann Gibbs, NP   Subjective:   Patient ID:  Douglas Hawkins is a 33 y.o. (DOB [birth year]) male.  Chief Complaint: No chief complaint on file.   HPI Trendon Juanmanuel Marohl presents for follow-up of GAD, MDD, and ADD.  Describes mood today as "ok". Pleasant. Denies tearfulness. Mood symptoms - reports depression at times - "it comes and goes". Reports stable interest and motivation. Denies anxiety  and irritability. Reports social anxiety. Denies panic attacks. Denies worry, rumination and over thinking. Denies obsessive thoughts and acts. Reports mood as stable. Stating "I feel like I'm doing really good". Feels like medications are helpful. Taking medications as prescribed.  Energy levels stable. Active, exercising some days - stationary bike and cardio.  Enjoys some usual interests and activities. Single. Lives with parents - dog. Has an older sister in [place]. Spending time with family. Appetite  adequate. Weight loss - 203 from 258 pounds - low carb diet.  Sleeps well most nights. Averages 8 hours. Focus and concentration stable with Adderall. Completing tasks. Managing aspects of household. Attending school 2 classes - general education credits. Denies SI or HI.  Denies AH or VH. Denies self harm.  Denies substance use. Attends AA and NA meetings.  Previous medication trials: Adderall, Zoloft, Clonazepam, Concerta, Ritalin, Prozac, Buspar, Wellbutrin, and Effexor.  Review of Systems:  Review of Systems  Musculoskeletal:  Negative for gait problem.  Neurological:  Negative for tremors.  Psychiatric/Behavioral:         Please refer to HPI    Medications: I have reviewed the patient's current medications.  Current Outpatient Medications  Medication Sig Dispense Refill   amphetamine-dextroamphetamine (ADDERALL XR) 30 MG 24 hr capsule Take 1 capsule (30 mg total) by mouth every morning. 30 capsule 0   amphetamine-dextroamphetamine (ADDERALL) 10 MG tablet Take 1 tablet (10 mg total) by mouth daily at 2 PM. 30 tablet 0   Multiple Vitamin (MULTIVITAMIN) capsule Take 1 capsule by mouth daily.     sertraline (ZOLOFT) 50 MG tablet Take 1 tablet (50 mg total) by mouth daily. 90 tablet 1   No current facility-administered medications for this visit.    Medication Side Effects: None  Allergies: No Known Allergies  Past Medical History:  Diagnosis Date   ACL injury tear    left   ADHD (attention deficit hyperactivity disorder)     Family History  Problem Relation Age of Onset   ADD / ADHD Mother    Hypertension Father    Diabetes Father    Diabetes Paternal  Grandfather    Suicidality Cousin    Bipolar disorder Cousin     Social History   Socioeconomic History   Marital status: Single    Spouse name: Not on file   Number of children: Not on file   Years of education: Not on file   Highest education level: Not on file  Occupational History   Not on file  Tobacco  Use   Smoking status: Some Days    Types: Cigarettes   Smokeless tobacco: Never  Vaping Use   Vaping status: Every Day  Substance and Sexual Activity   Alcohol use: Yes    Comment: fifth/day   Drug use: Not Currently    Comment: last IVD use 12/2020   Sexual activity: Not on file  Other Topics Concern   Not on file  Social History Narrative   Not on file   Social Drivers of Health   Financial Resource Strain: Not on file  Food Insecurity: Low Risk  (10/24/2022)   [facility]   Hawkins Vital Sign    Worried About Running Out of Food in the Last Year: Never true    Ran Out of Food in the Last Year: Never true  Transportation Needs: Not on file (10/24/2022)  Physical Activity: Not on file  Stress: Not on file  Social Connections: Not on file  Intimate Partner Violence: Not on file    Past Medical History, Surgical history, Social history, and Family history were reviewed and updated as appropriate.   Please see review of systems for further details on the patient's review from today.   Objective:   Physical Exam:  There were no vitals taken for this visit.  Physical Exam Constitutional:      General: He is not in acute distress. Musculoskeletal:        General: No deformity.  Neurological:     Mental Status: He is alert and oriented to person, place, and time.     Coordination: Coordination normal.  Psychiatric:        Attention and Perception: Attention and perception normal. He does not perceive auditory or visual hallucinations.        Mood and Affect: Affect is not labile, blunt, angry or inappropriate.        Speech: Speech normal.        Behavior: Behavior normal.        Thought Content: Thought content normal. Thought content is not paranoid or delusional. Thought content does not include homicidal or suicidal ideation. Thought content does not include homicidal or suicidal plan.        Cognition and Memory: Cognition and memory  normal.        Judgment: Judgment normal.     Comments: Insight intact     Lab Review:     Component Value Date/Time   NA 136 10/18/2022 1732   K 3.9 10/18/2022 1732   CL 103 10/18/2022 1732   CO2 16 (L) 10/18/2022 1732   GLUCOSE 93 10/18/2022 1732   BUN 7 10/18/2022 1732   CREATININE 0.72 10/18/2022 1732   CALCIUM 9.7 10/18/2022 1732   PROT 7.7 10/18/2022 1732   ALBUMIN 4.8 10/18/2022 1732   AST 857 (H) 10/18/2022 1732   ALT 755 (H) 10/18/2022 1732   ALKPHOS 134 (H) 10/18/2022 1732   BILITOT 1.2 10/18/2022 1732   GFRNONAA >60 10/18/2022 1732   GFRAA >60 10/24/2018 0017       Component Value Date/Time  WBC 4.2 10/18/2022 1732   RBC 4.98 10/18/2022 1732   HGB 15.5 10/18/2022 1732   HCT 43.9 10/18/2022 1732   PLT 172 10/18/2022 1732   MCV 88.2 10/18/2022 1732   MCH 31.1 10/18/2022 1732   MCHC 35.3 10/18/2022 1732   RDW 14.0 10/18/2022 1732   LYMPHSABS 1.4 10/24/2018 0017   MONOABS 0.5 10/24/2018 0017   EOSABS 0.1 10/24/2018 0017   BASOSABS 0.0 10/24/2018 0017    No results found for: "POCLITH", "LITHIUM"   No results found for: "PHENYTOIN", "PHENOBARB", "VALPROATE", "CBMZ"   .res Assessment: Plan:    Plan:  Adderall 10mg  in the afternoons for class work. Zoloft 50mg  daily Adderall XR 30mg  daily.  20 minutes spent dedicated to the care of this patient on the date of this encounter to include pre-visit review of records, ordering of medication, post visit documentation, and face-to-face time with the patient discussing ADHD, MDD and GAD. Discussed continuing current medication regimen.  Monitor BP between visits while taking stimulant medication.   Discussed potential benefits, risks, and side effects of stimulants with patient to include increased heart rate, palpitations, insomnia, increased anxiety, increased irritability, or decreased appetite.  Instructed patient to contact office if experiencing any significant tolerability issues.   Diagnoses and all  orders for this visit:  Attention deficit hyperactivity disorder (ADHD), unspecified ADHD type -     amphetamine-dextroamphetamine (ADDERALL XR) 30 MG 24 hr capsule; Take 1 capsule (30 mg total) by mouth every morning. -     amphetamine-dextroamphetamine (ADDERALL) 10 MG tablet; Take 1 tablet (10 mg total) by mouth daily at 2 PM.  Generalized anxiety disorder  Major depressive disorder, recurrent episode, moderate (HCC)     Please see After Visit Summary for patient specific instructions.  No future appointments.   No orders of the defined types were placed in this encounter.     -------------------------------

## 2023-07-27 ENCOUNTER — Other Ambulatory Visit (HOSPITAL_COMMUNITY): Payer: Self-pay

## 2023-07-28 ENCOUNTER — Other Ambulatory Visit (HOSPITAL_COMMUNITY): Payer: Self-pay

## 2023-08-20 ENCOUNTER — Other Ambulatory Visit (HOSPITAL_COMMUNITY): Payer: Self-pay

## 2023-08-20 ENCOUNTER — Encounter: Payer: Self-pay | Admitting: Adult Health

## 2023-08-20 ENCOUNTER — Telehealth: Admitting: Adult Health

## 2023-08-20 DIAGNOSIS — F909 Attention-deficit hyperactivity disorder, unspecified type: Secondary | ICD-10-CM

## 2023-08-20 DIAGNOSIS — F411 Generalized anxiety disorder: Secondary | ICD-10-CM

## 2023-08-20 DIAGNOSIS — F331 Major depressive disorder, recurrent, moderate: Secondary | ICD-10-CM | POA: Diagnosis not present

## 2023-08-20 MED ORDER — AMPHETAMINE-DEXTROAMPHETAMINE 10 MG PO TABS
10.0000 mg | ORAL_TABLET | Freq: Every day | ORAL | 0 refills | Status: DC
  Filled 2023-08-20 (×3): qty 30, 30d supply, fill #0

## 2023-08-20 MED ORDER — AMPHETAMINE-DEXTROAMPHET ER 30 MG PO CP24
30.0000 mg | ORAL_CAPSULE | ORAL | 0 refills | Status: DC
  Filled 2023-08-20 – 2023-08-25 (×3): qty 30, 30d supply, fill #0

## 2023-08-20 NOTE — Progress Notes (Signed)
 Douglas Hawkins [id number] 1990-09-30 33 y.o.  Virtual Visit via Video Note  I connected with pt @ on 08/20/23 at  2:30 PM EDT by a video enabled telemedicine application and verified that I am speaking with the correct person using two identifiers.   I discussed the limitations of evaluation and management by telemedicine and the availability of in person appointments. The patient expressed understanding and agreed to proceed.  I discussed the assessment and treatment plan with the patient. The patient was provided an opportunity to ask questions and all were answered. The patient agreed with the plan and demonstrated an understanding of the instructions.   The patient was advised to call back or seek an in-person evaluation if the symptoms worsen or if the condition fails to improve as anticipated.  I provided 20 minutes of non-face-to-face time during this encounter.  The patient was located at home.  The provider was located at [facility].   Reagan Camera, NP   Subjective:   Patient ID:  Douglas Hawkins is a 33 y.o. (DOB 1991-02-12) male.  Chief Complaint: No chief complaint on file.   HPI Douglas Hawkins presents for follow-up of GAD, MDD, and ADD.  Describes mood today as "ok". Pleasant. Denies tearfulness. Mood symptoms - reports depression at times - "a little bit here and there". Reports stable interest and motivation. Denies anxiety and irritability. Reports social anxiety. Denies panic attacks. Denies worry, rumination and over thinking. Denies obsessive thoughts and acts. Reports mood as stable. Stating "I feel like I'm doing ok ". Feels like medications are helpful. Taking medications as prescribed.  Energy levels stable. Active, exercising some days - stationary bike and cardio.  Enjoys some usual interests and activities. Single. Lives with parents - dog. Has an older sister in [place]. Spending time with family. Appetite  adequate. Weight loss - 200 from 203 pounds - low carb diet.  Sleeps well most nights. Averages 8 hours. Focus and concentration stable with Adderall. Completing tasks. Managing aspects of household. Attending school 2 classes - general education credits. Denies SI or HI.  Denies AH or VH. Denies self harm.  Denies substance use. Attends AA and NA meetings.  Previous medication trials: Adderall, Zoloft , Clonazepam , Concerta, Ritalin, Prozac, Buspar, Wellbutrin, and Effexor.   Review of Systems:  Review of Systems  Musculoskeletal:  Negative for gait problem.  Neurological:  Negative for tremors.  Psychiatric/Behavioral:         Please refer to HPI    Medications: I have reviewed the patient's current medications.  Current Outpatient Medications  Medication Sig Dispense Refill   amphetamine -dextroamphetamine  (ADDERALL XR) 30 MG 24 hr capsule Take 1 capsule (30 mg total) by mouth every morning. 30 capsule 0   amphetamine -dextroamphetamine  (ADDERALL) 10 MG tablet Take 1 tablet (10 mg total) by mouth daily at 2 PM. 30 tablet 0   Multiple Vitamin (MULTIVITAMIN) capsule Take 1 capsule by mouth daily.     sertraline  (ZOLOFT ) 50 MG tablet Take 1 tablet (50 mg total) by mouth daily. 90 tablet 1   No current facility-administered medications for this visit.    Medication Side Effects: None  Allergies: No Known Allergies  Past Medical History:  Diagnosis Date   ACL injury tear    left   ADHD (attention deficit hyperactivity disorder)     Family History  Problem Relation Age of Onset   ADD / ADHD Mother    Hypertension Father    Diabetes Father  Diabetes Paternal Grandfather    Suicidality Cousin    Bipolar disorder Cousin     Social History   Socioeconomic History   Marital status: Single    Spouse name: Not on file   Number of children: Not on file   Years of education: Not on file   Highest education level: Not on file  Occupational History   Not on file   Tobacco Use   Smoking status: Some Days    Types: Cigarettes   Smokeless tobacco: Never  Vaping Use   Vaping status: Every Day  Substance and Sexual Activity   Alcohol use: Yes    Comment: fifth/day   Drug use: Not Currently    Comment: last IVD use 12/2020   Sexual activity: Not on file  Other Topics Concern   Not on file  Social History Narrative   Not on file   Social Drivers of Health   Financial Resource Strain: Not on file  Food Insecurity: Low Risk  (10/24/2022)   [facility]   Hunger Vital Sign    Worried About Running Out of Food in the Last Year: Never true    Ran Out of Food in the Last Year: Never true  Transportation Needs: Not on file (10/24/2022)  Physical Activity: Not on file  Stress: Not on file  Social Connections: Not on file  Intimate Partner Violence: Not on file    Past Medical History, Surgical history, Social history, and Family history were reviewed and updated as appropriate.   Please see review of systems for further details on the patient's review from today.   Objective:   Physical Exam:  There were no vitals taken for this visit.  Physical Exam Constitutional:      General: He is not in acute distress. Musculoskeletal:        General: No deformity.  Neurological:     Mental Status: He is alert and oriented to person, place, and time.     Coordination: Coordination normal.  Psychiatric:        Attention and Perception: Attention and perception normal. He does not perceive auditory or visual hallucinations.        Mood and Affect: Mood normal. Mood is not anxious or depressed. Affect is not labile, blunt, angry or inappropriate.        Speech: Speech normal.        Behavior: Behavior normal.        Thought Content: Thought content normal. Thought content is not paranoid or delusional. Thought content does not include homicidal or suicidal ideation. Thought content does not include homicidal or suicidal  plan.        Cognition and Memory: Cognition and memory normal.        Judgment: Judgment normal.     Comments: Insight intact     Lab Review:     Component Value Date/Time   NA 136 10/18/2022 1732   K 3.9 10/18/2022 1732   CL 103 10/18/2022 1732   CO2 16 (L) 10/18/2022 1732   GLUCOSE 93 10/18/2022 1732   BUN 7 10/18/2022 1732   CREATININE 0.72 10/18/2022 1732   CALCIUM 9.7 10/18/2022 1732   PROT 7.7 10/18/2022 1732   ALBUMIN 4.8 10/18/2022 1732   AST 857 (H) 10/18/2022 1732   ALT 755 (H) 10/18/2022 1732   ALKPHOS 134 (H) 10/18/2022 1732   BILITOT 1.2 10/18/2022 1732   GFRNONAA >60 10/18/2022 1732   GFRAA >60 10/24/2018 0017  Component Value Date/Time   WBC 4.2 10/18/2022 1732   RBC 4.98 10/18/2022 1732   HGB 15.5 10/18/2022 1732   HCT 43.9 10/18/2022 1732   PLT 172 10/18/2022 1732   MCV 88.2 10/18/2022 1732   MCH 31.1 10/18/2022 1732   MCHC 35.3 10/18/2022 1732   RDW 14.0 10/18/2022 1732   LYMPHSABS 1.4 10/24/2018 0017   MONOABS 0.5 10/24/2018 0017   EOSABS 0.1 10/24/2018 0017   BASOSABS 0.0 10/24/2018 0017    No results found for: "POCLITH", "LITHIUM"   No results found for: "PHENYTOIN", "PHENOBARB", "VALPROATE", "CBMZ"   .res Assessment: Plan:    Plan:  Adderall 10mg  in the afternoons for class work. Zoloft  50mg  daily Adderall XR 30mg  daily.  20 minutes spent dedicated to the care of this patient on the date of this encounter to include pre-visit review of records, ordering of medication, post visit documentation, and face-to-face time with the patient discussing ADHD, MDD and GAD. Discussed continuing current medication regimen.  Monitor BP between visits while taking stimulant medication.   Discussed potential benefits, risks, and side effects of stimulants with patient to include increased heart rate, palpitations, insomnia, increased anxiety, increased irritability, or decreased appetite.  Instructed patient to contact office if experiencing  any significant tolerability issues.   There are no diagnoses linked to this encounter.   Please see After Visit Summary for patient specific instructions.  Future Appointments  Date Time Provider Department Center  08/20/2023  2:30 PM Caress Reffitt Nattalie, NP [facility] None    No orders of the defined types were placed in this encounter.     -------------------------------

## 2023-08-25 ENCOUNTER — Other Ambulatory Visit (HOSPITAL_COMMUNITY): Payer: Self-pay

## 2023-09-17 ENCOUNTER — Telehealth (INDEPENDENT_AMBULATORY_CARE_PROVIDER_SITE_OTHER): Admitting: Adult Health

## 2023-09-17 ENCOUNTER — Other Ambulatory Visit (HOSPITAL_COMMUNITY): Payer: Self-pay

## 2023-09-17 ENCOUNTER — Encounter: Payer: Self-pay | Admitting: Adult Health

## 2023-09-17 DIAGNOSIS — F329 Major depressive disorder, single episode, unspecified: Secondary | ICD-10-CM | POA: Diagnosis not present

## 2023-09-17 DIAGNOSIS — F411 Generalized anxiety disorder: Secondary | ICD-10-CM | POA: Diagnosis not present

## 2023-09-17 DIAGNOSIS — F909 Attention-deficit hyperactivity disorder, unspecified type: Secondary | ICD-10-CM | POA: Diagnosis not present

## 2023-09-17 MED ORDER — AMPHETAMINE-DEXTROAMPHET ER 30 MG PO CP24
30.0000 mg | ORAL_CAPSULE | ORAL | 0 refills | Status: DC
  Filled 2023-09-17 – 2023-09-24 (×2): qty 30, 30d supply, fill #0

## 2023-09-17 MED ORDER — AMPHETAMINE-DEXTROAMPHETAMINE 10 MG PO TABS
10.0000 mg | ORAL_TABLET | Freq: Every day | ORAL | 0 refills | Status: DC
  Filled 2023-09-17: qty 30, 30d supply, fill #0

## 2023-09-17 NOTE — Progress Notes (Signed)
 Douglas Hawkins [id number] [birth year] 33 y.o.  Virtual Visit via Video Note  I connected with pt @ on 09/17/23 at  3:00 PM EDT by a video enabled telemedicine application and verified that I am speaking with the correct person using two identifiers.   I discussed the limitations of evaluation and management by telemedicine and the availability of in person appointments. The patient expressed understanding and agreed to proceed.  I discussed the assessment and treatment plan with the patient. The patient was provided an opportunity to ask questions and all were answered. The patient agreed with the plan and demonstrated an understanding of the instructions.   The patient was advised to call back or seek an in-person evaluation if the symptoms worsen or if the condition fails to improve as anticipated.  I provided 15 minutes of non-face-to-face time during this encounter.  The patient was located at home.  The provider was located at [facility].   Reagan Camera, NP   Subjective:   Patient ID:  Douglas Hawkins is a 33 y.o. (DOB Jul 27, 1990) male.  Chief Complaint: No chief complaint on file.   HPI Douglas Hawkins presents for follow-up of GAD, MDD, and ADD.  Describes mood today as "ok". Pleasant. Denies tearfulness. Mood symptoms - denies depression, anxiety and irritability. Reports stable interest and motivation. Reports social anxiety. Denies panic attacks. Denies worry, rumination and over thinking. Denies obsessive thoughts and acts. Reports mood is stable. Stating "I feel like I'm doing alright". Feels like medications are helpful. Taking medications as prescribed.  Energy levels stable. Active, exercising some days - stationary bike and cardio.  Enjoys some usual interests and activities. Single. Lives with parents - dog. Has an older sister in [place]. Spending time with family. Appetite adequate. Weight loss - 195 from 200 pounds - low  carb diet.  Sleeps well most nights. Averages 8 hours. Focus and concentration stable with Adderall. Completing tasks. Managing aspects of household. Attending school 2 classes - general education credits. Denies SI or HI.  Denies AH or VH. Denies self harm.  Denies substance use. Attends AA and NA meetings.  Previous medication trials: Adderall, Zoloft , Clonazepam , Concerta, Ritalin, Prozac, Buspar, Wellbutrin, and Effexor.   Review of Systems:  Review of Systems  Musculoskeletal:  Negative for gait problem.  Neurological:  Negative for tremors.  Psychiatric/Behavioral:         Please refer to HPI    Medications: I have reviewed the patient's current medications.  Current Outpatient Medications  Medication Sig Dispense Refill   amphetamine -dextroamphetamine  (ADDERALL XR) 30 MG 24 hr capsule Take 1 capsule (30 mg total) by mouth every morning. 30 capsule 0   amphetamine -dextroamphetamine  (ADDERALL) 10 MG tablet Take 1 tablet (10 mg total) by mouth daily at 2 PM. 30 tablet 0   Multiple Vitamin (MULTIVITAMIN) capsule Take 1 capsule by mouth daily.     sertraline  (ZOLOFT ) 50 MG tablet Take 1 tablet (50 mg total) by mouth daily. 90 tablet 1   No current facility-administered medications for this visit.    Medication Side Effects: None  Allergies: No Known Allergies  Past Medical History:  Diagnosis Date   ACL injury tear    left   ADHD (attention deficit hyperactivity disorder)     Family History  Problem Relation Age of Onset   ADD / ADHD Mother    Hypertension Father    Diabetes Father    Diabetes Paternal Grandfather    Suicidality Cousin  Bipolar disorder Cousin     Social History   Socioeconomic History   Marital status: Single    Spouse name: Not on file   Number of children: Not on file   Years of education: Not on file   Highest education level: Not on file  Occupational History   Not on file  Tobacco Use   Smoking status: Some Days    Types:  Cigarettes   Smokeless tobacco: Never  Vaping Use   Vaping status: Every Day  Substance and Sexual Activity   Alcohol use: Yes    Comment: fifth/day   Drug use: Not Currently    Comment: last IVD use 12/2020   Sexual activity: Not on file  Other Topics Concern   Not on file  Social History Narrative   Not on file   Social Drivers of Health   Financial Resource Strain: Not on file  Food Insecurity: Low Risk  (10/24/2022)   [facility]   Hunger Vital Sign    Worried About Running Out of Food in the Last Year: Never true    Ran Out of Food in the Last Year: Never true  Transportation Needs: Not on file (10/24/2022)  Physical Activity: Not on file  Stress: Not on file  Social Connections: Not on file  Intimate Partner Violence: Not on file    Past Medical History, Surgical history, Social history, and Family history were reviewed and updated as appropriate.   Please see review of systems for further details on the patient's review from today.   Objective:   Physical Exam:  There were no vitals taken for this visit.  Physical Exam Constitutional:      General: He is not in acute distress. Musculoskeletal:        General: No deformity.  Neurological:     Mental Status: He is alert and oriented to person, place, and time.     Coordination: Coordination normal.  Psychiatric:        Attention and Perception: Attention and perception normal. He does not perceive auditory or visual hallucinations.        Mood and Affect: Mood normal. Mood is not anxious or depressed. Affect is not labile, blunt, angry or inappropriate.        Speech: Speech normal.        Behavior: Behavior normal.        Thought Content: Thought content normal. Thought content is not paranoid or delusional. Thought content does not include homicidal or suicidal ideation. Thought content does not include homicidal or suicidal plan.        Cognition and Memory: Cognition and memory  normal.        Judgment: Judgment normal.     Comments: Insight intact     Lab Review:     Component Value Date/Time   NA 136 10/18/2022 1732   K 3.9 10/18/2022 1732   CL 103 10/18/2022 1732   CO2 16 (L) 10/18/2022 1732   GLUCOSE 93 10/18/2022 1732   BUN 7 10/18/2022 1732   CREATININE 0.72 10/18/2022 1732   CALCIUM 9.7 10/18/2022 1732   PROT 7.7 10/18/2022 1732   ALBUMIN 4.8 10/18/2022 1732   AST 857 (H) 10/18/2022 1732   ALT 755 (H) 10/18/2022 1732   ALKPHOS 134 (H) 10/18/2022 1732   BILITOT 1.2 10/18/2022 1732   GFRNONAA >60 10/18/2022 1732   GFRAA >60 10/24/2018 0017       Component Value Date/Time  WBC 4.2 10/18/2022 1732   RBC 4.98 10/18/2022 1732   HGB 15.5 10/18/2022 1732   HCT 43.9 10/18/2022 1732   PLT 172 10/18/2022 1732   MCV 88.2 10/18/2022 1732   MCH 31.1 10/18/2022 1732   MCHC 35.3 10/18/2022 1732   RDW 14.0 10/18/2022 1732   LYMPHSABS 1.4 10/24/2018 0017   MONOABS 0.5 10/24/2018 0017   EOSABS 0.1 10/24/2018 0017   BASOSABS 0.0 10/24/2018 0017    No results found for: "POCLITH", "LITHIUM"   No results found for: "PHENYTOIN", "PHENOBARB", "VALPROATE", "CBMZ"   .res Assessment: Plan:    Plan:  Adderall 10mg  in the afternoons for class work. Zoloft  50mg  daily Adderall XR 30mg  daily.  15 minutes spent dedicated to the care of this patient on the date of this encounter to include pre-visit review of records, ordering of medication, post visit documentation, and face-to-face time with the patient discussing ADHD, MDD and GAD. Discussed continuing current medication regimen.  Monitor BP between visits while taking stimulant medication.   Discussed potential benefits, risks, and side effects of stimulants with patient to include increased heart rate, palpitations, insomnia, increased anxiety, increased irritability, or decreased appetite.  Instructed patient to contact office if experiencing any significant tolerability issues.   There are no  diagnoses linked to this encounter.   Please see After Visit Summary for patient specific instructions.  No future appointments.  No orders of the defined types were placed in this encounter.     -------------------------------

## 2023-09-23 ENCOUNTER — Other Ambulatory Visit (HOSPITAL_COMMUNITY): Payer: Self-pay

## 2023-09-24 ENCOUNTER — Other Ambulatory Visit: Payer: Self-pay

## 2023-09-24 ENCOUNTER — Other Ambulatory Visit (HOSPITAL_COMMUNITY): Payer: Self-pay

## 2023-10-06 ENCOUNTER — Encounter: Payer: Self-pay | Admitting: Adult Health

## 2023-10-06 ENCOUNTER — Telehealth: Admitting: Adult Health

## 2023-10-06 ENCOUNTER — Other Ambulatory Visit (HOSPITAL_COMMUNITY): Payer: Self-pay

## 2023-10-06 DIAGNOSIS — F411 Generalized anxiety disorder: Secondary | ICD-10-CM

## 2023-10-06 DIAGNOSIS — F909 Attention-deficit hyperactivity disorder, unspecified type: Secondary | ICD-10-CM

## 2023-10-06 DIAGNOSIS — F331 Major depressive disorder, recurrent, moderate: Secondary | ICD-10-CM | POA: Diagnosis not present

## 2023-10-06 MED ORDER — AMPHETAMINE-DEXTROAMPHET ER 30 MG PO CP24
30.0000 mg | ORAL_CAPSULE | ORAL | 0 refills | Status: DC
  Filled 2023-10-06 – 2023-10-22 (×4): qty 30, 30d supply, fill #0

## 2023-10-06 MED ORDER — AMPHETAMINE-DEXTROAMPHETAMINE 10 MG PO TABS
10.0000 mg | ORAL_TABLET | Freq: Every day | ORAL | 0 refills | Status: DC
  Filled 2023-10-06 – 2023-10-19 (×2): qty 30, 30d supply, fill #0

## 2023-10-06 NOTE — Progress Notes (Signed)
 Douglas Hawkins [id number] May 03, 1990 33 y.o.  Virtual Visit via Video Note  I connected with pt @ on 10/06/23 at 12:00 PM EDT by a video enabled telemedicine application and verified that I am speaking with the correct person using two identifiers.   I discussed the limitations of evaluation and management by telemedicine and the availability of in person appointments. The patient expressed understanding and agreed to proceed.  I discussed the assessment and treatment plan with the patient. The patient was provided an opportunity to ask questions and all were answered. The patient agreed with the plan and demonstrated an understanding of the instructions.   The patient was advised to call back or seek an in-person evaluation if the symptoms worsen or if the condition fails to improve as anticipated.  I provided 15 minutes of non-face-to-face time during this encounter.  The patient was located at home.  The provider was located at [facility].   Reagan Camera, NP   Subjective:   Patient ID:  Douglas Hawkins is a 33 y.o. (DOB 10-Jun-1990) male.  Chief Complaint: No chief complaint on file.   HPI Douglas Hawkins presents for follow-up of GAD, MDD, and ADD.  Describes mood today as "ok". Pleasant. Denies tearfulness. Mood symptoms - denies depression, anxiety and irritability. Reports stable interest and motivation. Reports social anxiety - "a little bit". Denies panic attacks. Denies worry, rumination and over thinking. Denies obsessive thoughts and acts. Reports mood is stable. Stating "I feel like I'm doing ok, overall". Feels like medications are helpful. Taking medications as prescribed.  Energy levels stable. Active, exercising some days - stationary bike and cardio.  Enjoys some usual interests and activities. Single. Lives with parents - dog. Has an older sister in [place]. Spending time with family. Appetite adequate. Weight loss - 190  from 200 pounds - keto diet.  Sleeps well most nights. Averages 8 hours. Focus and concentration stable with Adderall. Completing tasks. Managing aspects of household. Attending school 2 classes - general education credits. Denies SI or HI.  Denies AH or VH. Denies self harm.  Denies substance use. Attends AA and NA meetings.  Previous medication trials: Adderall, Zoloft , Clonazepam , Concerta, Ritalin, Prozac, Buspar, Wellbutrin, and Effexor.    Review of Systems:  Review of Systems  Musculoskeletal:  Negative for gait problem.  Neurological:  Negative for tremors.  Psychiatric/Behavioral:         Please refer to HPI    Medications: I have reviewed the patient's current medications.  Current Outpatient Medications  Medication Sig Dispense Refill   amphetamine -dextroamphetamine  (ADDERALL XR) 30 MG 24 hr capsule Take 1 capsule (30 mg total) by mouth every morning. 30 capsule 0   amphetamine -dextroamphetamine  (ADDERALL) 10 MG tablet Take 1 tablet (10 mg total) by mouth daily at 2 PM. 30 tablet 0   Multiple Vitamin (MULTIVITAMIN) capsule Take 1 capsule by mouth daily.     sertraline  (ZOLOFT ) 50 MG tablet Take 1 tablet (50 mg total) by mouth daily. 90 tablet 1   No current facility-administered medications for this visit.    Medication Side Effects: None  Allergies: No Known Allergies  Past Medical History:  Diagnosis Date   ACL injury tear    left   ADHD (attention deficit hyperactivity disorder)     Family History  Problem Relation Age of Onset   ADD / ADHD Mother    Hypertension Father    Diabetes Father    Diabetes Paternal Grandfather    Suicidality  Cousin    Bipolar disorder Cousin     Social History   Socioeconomic History   Marital status: Single    Spouse name: Not on file   Number of children: Not on file   Years of education: Not on file   Highest education level: Not on file  Occupational History   Not on file  Tobacco Use   Smoking status: Some  Days    Types: Cigarettes   Smokeless tobacco: Never  Vaping Use   Vaping status: Every Day  Substance and Sexual Activity   Alcohol use: Yes    Comment: fifth/day   Drug use: Not Currently    Comment: last IVD use 12/2020   Sexual activity: Not on file  Other Topics Concern   Not on file  Social History Narrative   Not on file   Social Drivers of Health   Financial Resource Strain: Not on file  Food Insecurity: Low Risk  (10/24/2022)   [facility]   Hunger Vital Sign    Worried About Running Out of Food in the Last Year: Never true    Ran Out of Food in the Last Year: Never true  Transportation Needs: Not on file (10/24/2022)  Physical Activity: Not on file  Stress: Not on file  Social Connections: Not on file  Intimate Partner Violence: Not on file    Past Medical History, Surgical history, Social history, and Family history were reviewed and updated as appropriate.   Please see review of systems for further details on the patient's review from today.   Objective:   Physical Exam:  There were no vitals taken for this visit.  Physical Exam Constitutional:      General: He is not in acute distress. Musculoskeletal:        General: No deformity.  Neurological:     Mental Status: He is alert and oriented to person, place, and time.     Coordination: Coordination normal.  Psychiatric:        Attention and Perception: Attention and perception normal. He does not perceive auditory or visual hallucinations.        Mood and Affect: Mood normal. Mood is not anxious or depressed. Affect is not labile, blunt, angry or inappropriate.        Speech: Speech normal.        Behavior: Behavior normal.        Thought Content: Thought content normal. Thought content is not paranoid or delusional. Thought content does not include homicidal or suicidal ideation. Thought content does not include homicidal or suicidal plan.        Cognition and Memory:  Cognition and memory normal.        Judgment: Judgment normal.     Comments: Insight intact     Lab Review:     Component Value Date/Time   NA 136 10/18/2022 1732   K 3.9 10/18/2022 1732   CL 103 10/18/2022 1732   CO2 16 (L) 10/18/2022 1732   GLUCOSE 93 10/18/2022 1732   BUN 7 10/18/2022 1732   CREATININE 0.72 10/18/2022 1732   CALCIUM 9.7 10/18/2022 1732   PROT 7.7 10/18/2022 1732   ALBUMIN 4.8 10/18/2022 1732   AST 857 (H) 10/18/2022 1732   ALT 755 (H) 10/18/2022 1732   ALKPHOS 134 (H) 10/18/2022 1732   BILITOT 1.2 10/18/2022 1732   GFRNONAA >60 10/18/2022 1732   GFRAA >60 10/24/2018 0017       Component  Value Date/Time   WBC 4.2 10/18/2022 1732   RBC 4.98 10/18/2022 1732   HGB 15.5 10/18/2022 1732   HCT 43.9 10/18/2022 1732   PLT 172 10/18/2022 1732   MCV 88.2 10/18/2022 1732   MCH 31.1 10/18/2022 1732   MCHC 35.3 10/18/2022 1732   RDW 14.0 10/18/2022 1732   LYMPHSABS 1.4 10/24/2018 0017   MONOABS 0.5 10/24/2018 0017   EOSABS 0.1 10/24/2018 0017   BASOSABS 0.0 10/24/2018 0017    No results found for: "POCLITH", "LITHIUM"   No results found for: "PHENYTOIN", "PHENOBARB", "VALPROATE", "CBMZ"   .res Assessment: Plan:    Plan:  Adderall 10mg  in the afternoons for class work. Zoloft  50mg  daily Adderall XR 30mg  daily.  15 minutes spent dedicated to the care of this patient on the date of this encounter to include pre-visit review of records, ordering of medication, post visit documentation, and face-to-face time with the patient discussing ADHD, MDD and GAD. Discussed continuing current medication regimen.  Monitor BP between visits while taking stimulant medication.   Discussed potential benefits, risks, and side effects of stimulants with patient to include increased heart rate, palpitations, insomnia, increased anxiety, increased irritability, or decreased appetite.  Instructed patient to contact office if experiencing any significant tolerability issues.   There are no diagnoses linked to this encounter.   Please see After Visit Summary for patient specific instructions.  Future Appointments  Date Time Provider Department Center  10/06/2023 12:00 PM Mauri Temkin Nattalie, NP [facility] None    No orders of the defined types were placed in this encounter.     -------------------------------

## 2023-10-19 ENCOUNTER — Other Ambulatory Visit (HOSPITAL_COMMUNITY): Payer: Self-pay

## 2023-10-21 ENCOUNTER — Other Ambulatory Visit (HOSPITAL_COMMUNITY): Payer: Self-pay

## 2023-10-22 ENCOUNTER — Other Ambulatory Visit (HOSPITAL_COMMUNITY): Payer: Self-pay

## 2023-11-20 ENCOUNTER — Encounter: Payer: Self-pay | Admitting: Adult Health

## 2023-11-20 ENCOUNTER — Telehealth: Admitting: Adult Health

## 2023-11-20 ENCOUNTER — Other Ambulatory Visit (HOSPITAL_COMMUNITY): Payer: Self-pay

## 2023-11-20 DIAGNOSIS — F339 Major depressive disorder, recurrent, unspecified: Secondary | ICD-10-CM

## 2023-11-20 DIAGNOSIS — F411 Generalized anxiety disorder: Secondary | ICD-10-CM | POA: Diagnosis not present

## 2023-11-20 DIAGNOSIS — F331 Major depressive disorder, recurrent, moderate: Secondary | ICD-10-CM

## 2023-11-20 DIAGNOSIS — F909 Attention-deficit hyperactivity disorder, unspecified type: Secondary | ICD-10-CM | POA: Diagnosis not present

## 2023-11-20 MED ORDER — AMPHETAMINE-DEXTROAMPHET ER 30 MG PO CP24
30.0000 mg | ORAL_CAPSULE | ORAL | 0 refills | Status: DC
  Filled 2023-11-20: qty 30, 30d supply, fill #0

## 2023-11-20 MED ORDER — AMPHETAMINE-DEXTROAMPHETAMINE 10 MG PO TABS
10.0000 mg | ORAL_TABLET | Freq: Every day | ORAL | 0 refills | Status: DC
  Filled 2023-11-20: qty 30, 30d supply, fill #0

## 2023-11-20 NOTE — Progress Notes (Signed)
 Douglas Hawkins [id number] Jun 10, 1990 33 y.o.  Virtual Visit via Video Note  I connected with pt @ on 11/20/23 at  4:30 PM EDT by a video enabled telemedicine application and verified that I am speaking with the correct person using two identifiers.   I discussed the limitations of evaluation and management by telemedicine and the availability of in person appointments. The patient expressed understanding and agreed to proceed.  I discussed the assessment and treatment plan with the patient. The patient was provided an opportunity to ask questions and all were answered. The patient agreed with the plan and demonstrated an understanding of the instructions.   The patient was advised to call back or seek an in-person evaluation if the symptoms worsen or if the condition fails to improve as anticipated.  I provided 15 minutes of non-face-to-face time during this encounter.  The patient was located at home.  The provider was located at [facility].   Angeline LOISE Sayers, NP   Subjective:   Patient ID:  Douglas Hawkins is a 33 y.o. (DOB [birth year]) male.  Chief Complaint: No chief complaint on file.   HPI Douglas Hawkins presents for follow-up of GAD, MDD, and ADD.  Describes mood today as ok. Pleasant. Denies tearfulness. Mood symptoms - denies depression, anxiety and irritability. Reports stable interest and motivation. Denies panic attacks. Denies worry, rumination and over thinking. Denies obsessive thoughts and acts. Reports mood is stable. Stating I feel like I'm doing pretty good. Feels like medications are helpful. Taking medications as prescribed.  Energy levels stable. Active, exercising some days - stationary bike and cardio.  Enjoys some usual interests and activities. Single. Lives with parents - dog. Has an older sister in [place]. Spending time with family. Appetite adequate. Weight loss - 185 from 200 pounds - keto diet.  Sleeps well  most nights. Averages 8 hours. Focus and concentration stable with Adderall. Completing tasks. Managing aspects of household. Attending school - [employer].  Denies SI or HI.  Denies AH or VH. Denies self harm.  Denies substance use. Attends AA and NA meetings.  Previous medication trials: Adderall, Zoloft , Clonazepam , Concerta, Ritalin, Prozac, Buspar, Wellbutrin, and Effexor.   Review of Systems:  Review of Systems  Musculoskeletal:  Negative for gait problem.  Neurological:  Negative for tremors.  Psychiatric/Behavioral:         Please refer to HPI    Medications: I have reviewed the patient's current medications.  Current Outpatient Medications  Medication Sig Dispense Refill   amphetamine -dextroamphetamine  (ADDERALL XR) 30 MG 24 hr capsule Take 1 capsule (30 mg total) by mouth every morning. 30 capsule 0   amphetamine -dextroamphetamine  (ADDERALL) 10 MG tablet Take 1 tablet (10 mg total) by mouth daily at 2 PM. 30 tablet 0   Multiple Vitamin (MULTIVITAMIN) capsule Take 1 capsule by mouth daily.     sertraline  (ZOLOFT ) 50 MG tablet Take 1 tablet (50 mg total) by mouth daily. 90 tablet 1   No current facility-administered medications for this visit.    Medication Side Effects: None  Allergies: No Known Allergies  Past Medical History:  Diagnosis Date   ACL injury tear    left   ADHD (attention deficit hyperactivity disorder)     Family History  Problem Relation Age of Onset   ADD / ADHD Mother    Hypertension Father    Diabetes Father    Diabetes Paternal Grandfather    Suicidality Cousin    Bipolar disorder Cousin  Social History   Socioeconomic History   Marital status: Single    Spouse name: Not on file   Number of children: Not on file   Years of education: Not on file   Highest education level: Not on file  Occupational History   Not on file  Tobacco Use   Smoking status: Some Days    Types: Cigarettes   Smokeless tobacco: Never  Vaping Use    Vaping status: Every Day  Substance and Sexual Activity   Alcohol use: Yes    Comment: fifth/day   Drug use: Not Currently    Comment: last IVD use 12/2020   Sexual activity: Not on file  Other Topics Concern   Not on file  Social History Narrative   Not on file   Social Drivers of Health   Financial Resource Strain: Not on file  Food Insecurity: Low Risk  (10/24/2022)   Received from [facility]   Hunger Vital Sign    Within the past 12 months, you worried that your food would run out before you got money to buy more: Never true    Within the past 12 months, the food you bought just didn't last and you didn't have money to get more. : Never true  Transportation Needs: Not on file (10/24/2022)  Physical Activity: Not on file  Stress: Not on file  Social Connections: Not on file  Intimate Partner Violence: Not on file    Past Medical History, Surgical history, Social history, and Family history were reviewed and updated as appropriate.   Please see review of systems for further details on the patient's review from today.   Objective:   Physical Exam:  There were no vitals taken for this visit.  Physical Exam Constitutional:      General: He is not in acute distress. Musculoskeletal:        General: No deformity.  Neurological:     Mental Status: He is alert and oriented to person, place, and time.     Coordination: Coordination normal.  Psychiatric:        Attention and Perception: Attention and perception normal. He does not perceive auditory or visual hallucinations.        Mood and Affect: Mood normal. Mood is not anxious or depressed. Affect is not labile, blunt, angry or inappropriate.        Speech: Speech normal.        Behavior: Behavior normal.        Thought Content: Thought content normal. Thought content is not paranoid or delusional. Thought content does not include homicidal or suicidal ideation. Thought content does not include homicidal or suicidal plan.         Cognition and Memory: Cognition and memory normal.        Judgment: Judgment normal.     Comments: Insight intact     Lab Review:     Component Value Date/Time   NA 136 10/18/2022 1732   K 3.9 10/18/2022 1732   CL 103 10/18/2022 1732   CO2 16 (L) 10/18/2022 1732   GLUCOSE 93 10/18/2022 1732   BUN 7 10/18/2022 1732   CREATININE 0.72 10/18/2022 1732   CALCIUM 9.7 10/18/2022 1732   PROT 7.7 10/18/2022 1732   ALBUMIN 4.8 10/18/2022 1732   AST 857 (H) 10/18/2022 1732   ALT 755 (H) 10/18/2022 1732   ALKPHOS 134 (H) 10/18/2022 1732   BILITOT 1.2 10/18/2022 1732   GFRNONAA >60 10/18/2022 1732   GFRAA >  60 10/24/2018 0017       Component Value Date/Time   WBC 4.2 10/18/2022 1732   RBC 4.98 10/18/2022 1732   HGB 15.5 10/18/2022 1732   HCT 43.9 10/18/2022 1732   PLT 172 10/18/2022 1732   MCV 88.2 10/18/2022 1732   MCH 31.1 10/18/2022 1732   MCHC 35.3 10/18/2022 1732   RDW 14.0 10/18/2022 1732   LYMPHSABS 1.4 10/24/2018 0017   MONOABS 0.5 10/24/2018 0017   EOSABS 0.1 10/24/2018 0017   BASOSABS 0.0 10/24/2018 0017    No results found for: POCLITH, LITHIUM   No results found for: PHENYTOIN, PHENOBARB, VALPROATE, CBMZ   .res Assessment: Plan:    Plan:  Adderall 10mg  in the afternoons for class work. Zoloft  50mg  daily Adderall XR 30mg  daily.  15 minutes spent dedicated to the care of this patient on the date of this encounter to include pre-visit review of records, ordering of medication, post visit documentation, and face-to-face time with the patient discussing ADHD, MDD and GAD. Discussed continuing current medication regimen.  Monitor BP between visits while taking stimulant medication.   Discussed potential benefits, risks, and side effects of stimulants with patient to include increased heart rate, palpitations, insomnia, increased anxiety, increased irritability, or decreased appetite.  Instructed patient to contact office if experiencing any  significant tolerability issues.  There are no diagnoses linked to this encounter.   Please see After Visit Summary for patient specific instructions.  No future appointments.  No orders of the defined types were placed in this encounter.     -------------------------------

## 2023-11-21 ENCOUNTER — Other Ambulatory Visit (HOSPITAL_COMMUNITY): Payer: Self-pay

## 2023-12-11 ENCOUNTER — Telehealth (INDEPENDENT_AMBULATORY_CARE_PROVIDER_SITE_OTHER): Payer: Self-pay | Admitting: Adult Health

## 2023-12-11 ENCOUNTER — Encounter: Payer: Self-pay | Admitting: Adult Health

## 2023-12-11 DIAGNOSIS — Z0389 Encounter for observation for other suspected diseases and conditions ruled out: Secondary | ICD-10-CM

## 2023-12-11 NOTE — Progress Notes (Signed)
 Patient no show appointment. ? ?

## 2023-12-15 ENCOUNTER — Telehealth: Payer: Self-pay | Admitting: Adult Health

## 2023-12-15 NOTE — Telephone Encounter (Signed)
 LF 7/26, due 8/23

## 2023-12-15 NOTE — Telephone Encounter (Signed)
 Pt  will be out of his Adderall on the 25th and has an appt on the 29th.   [employer]

## 2023-12-18 ENCOUNTER — Other Ambulatory Visit: Payer: Self-pay

## 2023-12-18 ENCOUNTER — Other Ambulatory Visit (HOSPITAL_COMMUNITY): Payer: Self-pay

## 2023-12-18 DIAGNOSIS — F909 Attention-deficit hyperactivity disorder, unspecified type: Secondary | ICD-10-CM

## 2023-12-18 MED ORDER — AMPHETAMINE-DEXTROAMPHET ER 30 MG PO CP24
30.0000 mg | ORAL_CAPSULE | Freq: Every morning | ORAL | 0 refills | Status: DC
  Filled 2023-12-19: qty 30, 30d supply, fill #0

## 2023-12-18 MED ORDER — AMPHETAMINE-DEXTROAMPHETAMINE 10 MG PO TABS
10.0000 mg | ORAL_TABLET | Freq: Every day | ORAL | 0 refills | Status: DC
  Filled 2023-12-19: qty 30, 30d supply, fill #0

## 2023-12-18 NOTE — Telephone Encounter (Signed)
 Pended

## 2023-12-19 ENCOUNTER — Other Ambulatory Visit (HOSPITAL_COMMUNITY): Payer: Self-pay

## 2023-12-23 ENCOUNTER — Other Ambulatory Visit (HOSPITAL_COMMUNITY): Payer: Self-pay

## 2023-12-25 ENCOUNTER — Telehealth (INDEPENDENT_AMBULATORY_CARE_PROVIDER_SITE_OTHER): Admitting: Adult Health

## 2023-12-25 ENCOUNTER — Other Ambulatory Visit (HOSPITAL_COMMUNITY): Payer: Self-pay

## 2023-12-25 ENCOUNTER — Encounter: Payer: Self-pay | Admitting: Adult Health

## 2023-12-25 DIAGNOSIS — F909 Attention-deficit hyperactivity disorder, unspecified type: Secondary | ICD-10-CM | POA: Diagnosis not present

## 2023-12-25 DIAGNOSIS — F331 Major depressive disorder, recurrent, moderate: Secondary | ICD-10-CM

## 2023-12-25 DIAGNOSIS — F411 Generalized anxiety disorder: Secondary | ICD-10-CM | POA: Diagnosis not present

## 2023-12-25 MED ORDER — AMPHETAMINE-DEXTROAMPHETAMINE 10 MG PO TABS
10.0000 mg | ORAL_TABLET | Freq: Every day | ORAL | 0 refills | Status: DC
  Filled 2023-12-25 – 2024-01-20 (×2): qty 30, 30d supply, fill #0

## 2023-12-25 MED ORDER — AMPHETAMINE-DEXTROAMPHET ER 30 MG PO CP24
30.0000 mg | ORAL_CAPSULE | Freq: Every morning | ORAL | 0 refills | Status: DC
  Filled 2023-12-25 – 2024-01-20 (×2): qty 30, 30d supply, fill #0

## 2023-12-25 NOTE — Progress Notes (Signed)
 Douglas Hawkins [id number] 1991/01/30 33 y.o.  Virtual Visit via Video Note  I connected with pt @ on 12/25/23 at  4:00 PM EDT by a video enabled telemedicine application and verified that I am speaking with the correct person using two identifiers.   I discussed the limitations of evaluation and management by telemedicine and the availability of in person appointments. The patient expressed understanding and agreed to proceed.  I discussed the assessment and treatment plan with the patient. The patient was provided an opportunity to ask questions and all were answered. The patient agreed with the plan and demonstrated an understanding of the instructions.   The patient was advised to call back or seek an in-person evaluation if the symptoms worsen or if the condition fails to improve as anticipated.  I provided 15 minutes of non-face-to-face time during this encounter.  The patient was located at home.  The provider was located at [facility].   Angeline LOISE Sayers, NP   Subjective:   Patient ID:  Douglas Hawkins is a 33 y.o. (DOB 1990/08/04) male.  Chief Complaint: No chief complaint on file.   HPI Douglas Hawkins presents for follow-up of GAD, MDD and ADD.  Describes mood today as ok. Pleasant. Denies tearfulness. Mood symptoms - denies depression, anxiety and irritability. Reports stable interest and motivation. Denies panic attacks. Denies worry, rumination and over thinking. Denies obsessive thoughts and acts. Reports mood is stable. Stating I feel like I'm doing good. Feels like medications are helpful. Taking medications as prescribed.  Energy levels stable. Active, exercising some days - stationary bike and cardio.  Enjoys some usual interests and activities. Single. Lives with parents - dog. Has an older sister in [place]. Spending time with family. Appetite adequate. Weight loss - 185 pounds. Sleeps well most nights. Averages 8  hours. Focus and concentration stable with Adderall. Completing tasks. Managing aspects of household. Attending school - [employer].  Denies SI or HI.  Denies AH or VH. Denies self harm.  Denies substance use. Attends AA and NA meetings.  Previous medication trials: Adderall, Zoloft , Clonazepam , Concerta, Ritalin, Prozac, Buspar, Wellbutrin, and Effexor.     Review of Systems:  Review of Systems  Musculoskeletal:  Negative for gait problem.  Neurological:  Negative for tremors.  Psychiatric/Behavioral:         Please refer to HPI    Medications: I have reviewed the patient's current medications.  Current Outpatient Medications  Medication Sig Dispense Refill   amphetamine -dextroamphetamine  (ADDERALL XR) 30 MG 24 hr capsule Take 1 capsule (30 mg total) by mouth in the morning. 30 capsule 0   amphetamine -dextroamphetamine  (ADDERALL) 10 MG tablet Take 1 tablet (10 mg total) by mouth daily at 2 PM. 30 tablet 0   Multiple Vitamin (MULTIVITAMIN) capsule Take 1 capsule by mouth daily.     sertraline  (ZOLOFT ) 50 MG tablet Take 1 tablet (50 mg total) by mouth daily. 90 tablet 1   No current facility-administered medications for this visit.    Medication Side Effects: None  Allergies: No Known Allergies  Past Medical History:  Diagnosis Date   ACL injury tear    left   ADHD (attention deficit hyperactivity disorder)     Family History  Problem Relation Age of Onset   ADD / ADHD Mother    Hypertension Father    Diabetes Father    Diabetes Paternal Grandfather    Suicidality Cousin    Bipolar disorder Cousin     Social History  Socioeconomic History   Marital status: Single    Spouse name: Not on file   Number of children: Not on file   Years of education: Not on file   Highest education level: Not on file  Occupational History   Not on file  Tobacco Use   Smoking status: Some Days    Types: Cigarettes   Smokeless tobacco: Never  Vaping Use   Vaping status: Every  Day  Substance and Sexual Activity   Alcohol use: Yes    Comment: fifth/day   Drug use: Not Currently    Comment: last IVD use 12/2020   Sexual activity: Not on file  Other Topics Concern   Not on file  Social History Narrative   Not on file   Social Drivers of Health   Financial Resource Strain: Not on file  Food Insecurity: Low Risk  (10/24/2022)   Received from [facility]   Hunger Vital Sign    Within the past 12 months, you worried that your food would run out before you got money to buy more: Never true    Within the past 12 months, the food you bought just didn't last and you didn't have money to get more. : Never true  Transportation Needs: Not on file (10/24/2022)  Physical Activity: Not on file  Stress: Not on file  Social Connections: Not on file  Intimate Partner Violence: Not on file    Past Medical History, Surgical history, Social history, and Family history were reviewed and updated as appropriate.   Please see review of systems for further details on the patient's review from today.   Objective:   Physical Exam:  There were no vitals taken for this visit.  Physical Exam Constitutional:      General: He is not in acute distress. Musculoskeletal:        General: No deformity.  Neurological:     Mental Status: He is alert and oriented to person, place, and time.     Coordination: Coordination normal.  Psychiatric:        Attention and Perception: Attention and perception normal. He does not perceive auditory or visual hallucinations.        Mood and Affect: Mood normal. Mood is not anxious or depressed. Affect is not labile, blunt, angry or inappropriate.        Speech: Speech normal.        Behavior: Behavior normal.        Thought Content: Thought content normal. Thought content is not paranoid or delusional. Thought content does not include homicidal or suicidal ideation. Thought content does not include homicidal or suicidal plan.        Cognition  and Memory: Cognition and memory normal.        Judgment: Judgment normal.     Comments: Insight intact     Lab Review:     Component Value Date/Time   NA 136 10/18/2022 1732   K 3.9 10/18/2022 1732   CL 103 10/18/2022 1732   CO2 16 (L) 10/18/2022 1732   GLUCOSE 93 10/18/2022 1732   BUN 7 10/18/2022 1732   CREATININE 0.72 10/18/2022 1732   CALCIUM 9.7 10/18/2022 1732   PROT 7.7 10/18/2022 1732   ALBUMIN 4.8 10/18/2022 1732   AST 857 (H) 10/18/2022 1732   ALT 755 (H) 10/18/2022 1732   ALKPHOS 134 (H) 10/18/2022 1732   BILITOT 1.2 10/18/2022 1732   GFRNONAA >60 10/18/2022 1732   GFRAA >60 10/24/2018 0017  Component Value Date/Time   WBC 4.2 10/18/2022 1732   RBC 4.98 10/18/2022 1732   HGB 15.5 10/18/2022 1732   HCT 43.9 10/18/2022 1732   PLT 172 10/18/2022 1732   MCV 88.2 10/18/2022 1732   MCH 31.1 10/18/2022 1732   MCHC 35.3 10/18/2022 1732   RDW 14.0 10/18/2022 1732   LYMPHSABS 1.4 10/24/2018 0017   MONOABS 0.5 10/24/2018 0017   EOSABS 0.1 10/24/2018 0017   BASOSABS 0.0 10/24/2018 0017    No results found for: POCLITH, LITHIUM   No results found for: PHENYTOIN, PHENOBARB, VALPROATE, CBMZ   .res Assessment: Plan:    Plan:  Adderall 10mg  in the afternoons for class work. Zoloft  50mg  daily Adderall XR 30mg  daily.  15 minutes spent dedicated to the care of this patient on the date of this encounter to include pre-visit review of records, ordering of medication, post visit documentation, and face-to-face time with the patient discussing ADHD, MDD and GAD. Discussed continuing current medication regimen.  Monitor BP between visits while taking stimulant medication.   Discussed potential benefits, risks, and side effects of stimulants with patient to include increased heart rate, palpitations, insomnia, increased anxiety, increased irritability, or decreased appetite.  Instructed patient to contact office if experiencing any significant  tolerability issues.   Diagnoses and all orders for this visit:  Attention deficit hyperactivity disorder (ADHD), unspecified ADHD type -     amphetamine -dextroamphetamine  (ADDERALL XR) 30 MG 24 hr capsule; Take 1 capsule (30 mg total) by mouth in the morning. -     amphetamine -dextroamphetamine  (ADDERALL) 10 MG tablet; Take 1 tablet (10 mg total) by mouth daily at 2 PM.  Generalized anxiety disorder  Major depressive disorder, recurrent episode, moderate (HCC)     Please see After Visit Summary for patient specific instructions.  No future appointments.  No orders of the defined types were placed in this encounter.     -------------------------------

## 2024-01-19 ENCOUNTER — Other Ambulatory Visit (HOSPITAL_COMMUNITY): Payer: Self-pay

## 2024-01-20 ENCOUNTER — Other Ambulatory Visit (HOSPITAL_COMMUNITY): Payer: Self-pay

## 2024-02-17 ENCOUNTER — Encounter: Payer: Self-pay | Admitting: Adult Health

## 2024-02-17 ENCOUNTER — Telehealth (INDEPENDENT_AMBULATORY_CARE_PROVIDER_SITE_OTHER): Payer: Self-pay | Admitting: Adult Health

## 2024-02-17 DIAGNOSIS — Z0389 Encounter for observation for other suspected diseases and conditions ruled out: Secondary | ICD-10-CM

## 2024-02-17 NOTE — Progress Notes (Signed)
Patient no show appointment. Called and LVM.  

## 2024-04-07 ENCOUNTER — Telehealth: Payer: Self-pay | Admitting: Adult Health

## 2024-04-07 NOTE — Telephone Encounter (Signed)
 Pt needs rf of Adderall 10 & 30mg    Appt 12/29     Douglas Hawkins outpatient

## 2024-04-08 ENCOUNTER — Other Ambulatory Visit: Payer: Self-pay

## 2024-04-08 ENCOUNTER — Other Ambulatory Visit (HOSPITAL_COMMUNITY): Payer: Self-pay

## 2024-04-08 DIAGNOSIS — F909 Attention-deficit hyperactivity disorder, unspecified type: Secondary | ICD-10-CM

## 2024-04-08 MED ORDER — AMPHETAMINE-DEXTROAMPHETAMINE 10 MG PO TABS
10.0000 mg | ORAL_TABLET | Freq: Every day | ORAL | 0 refills | Status: DC
  Filled 2024-04-08: qty 17, 17d supply, fill #0

## 2024-04-08 MED ORDER — AMPHETAMINE-DEXTROAMPHET ER 30 MG PO CP24
30.0000 mg | ORAL_CAPSULE | Freq: Every morning | ORAL | 0 refills | Status: DC
  Filled 2024-04-08: qty 17, 17d supply, fill #0

## 2024-04-08 NOTE — Telephone Encounter (Signed)
 Pended enough of both doses to get him to appt, was a NS last appt.

## 2024-04-09 ENCOUNTER — Other Ambulatory Visit (HOSPITAL_COMMUNITY): Payer: Self-pay

## 2024-04-25 ENCOUNTER — Encounter: Payer: Self-pay | Admitting: Adult Health

## 2024-04-25 ENCOUNTER — Telehealth: Admitting: Adult Health

## 2024-04-25 ENCOUNTER — Other Ambulatory Visit (HOSPITAL_COMMUNITY): Payer: Self-pay

## 2024-04-25 DIAGNOSIS — F909 Attention-deficit hyperactivity disorder, unspecified type: Secondary | ICD-10-CM

## 2024-04-25 DIAGNOSIS — F411 Generalized anxiety disorder: Secondary | ICD-10-CM

## 2024-04-25 DIAGNOSIS — F331 Major depressive disorder, recurrent, moderate: Secondary | ICD-10-CM | POA: Diagnosis not present

## 2024-04-25 MED ORDER — AMPHETAMINE-DEXTROAMPHETAMINE 10 MG PO TABS
10.0000 mg | ORAL_TABLET | Freq: Every day | ORAL | 0 refills | Status: AC
  Filled 2024-04-25: qty 30, 30d supply, fill #0

## 2024-04-25 MED ORDER — AMPHETAMINE-DEXTROAMPHET ER 30 MG PO CP24
30.0000 mg | ORAL_CAPSULE | Freq: Every morning | ORAL | 0 refills | Status: AC
  Filled 2024-04-25: qty 30, 30d supply, fill #0

## 2024-04-25 NOTE — Progress Notes (Signed)
 Douglas Hawkins [id number] 05-Sep-1990 33 y.o.  Virtual Visit via Video Note  I connected with pt @ on 04/25/2024 at  5:30 PM EST by a video enabled telemedicine application and verified that I am speaking with the correct person using two identifiers.   I discussed the limitations of evaluation and management by telemedicine and the availability of in person appointments. The patient expressed understanding and agreed to proceed.  I discussed the assessment and treatment plan with the patient. The patient was provided an opportunity to ask questions and all were answered. The patient agreed with the plan and demonstrated an understanding of the instructions.   The patient was advised to call back or seek an in-person evaluation if the symptoms worsen or if the condition fails to improve as anticipated.  I provided 10 minutes of non-face-to-face time during this encounter.  The patient was located at home.  The provider was located at [facility].   Angeline LOISE Sayers, NP   Subjective:   Patient ID:  Douglas Hawkins is a 33 y.o. (DOB [birth year]) male.  Chief Complaint: No chief complaint on file.   HPI Raquel Bertrand Harpham presents for follow-up of GAD, MDD and ADD.  Describes mood today as ok. Pleasant. Denies tearfulness. Mood symptoms - denies depression, anxiety and irritability. Reports stable interest and motivation. Denies panic attacks. Denies worry, rumination and over thinking. Denies obsessive thoughts and acts. Reports mood is stable. Stating I feel like I'm doing pretty good. Feels like medications are helpful. Taking medications as prescribed.  Energy levels reasonable. Active, exercising some days - walking. Enjoys some usual interests and activities. Single. Lives with parents - dog.  Spending time with family. Appetite adequate. Weight gain - 205 pounds. Sleeps well most nights. Averages 8 hours. Focus and concentration stable  with Adderall. Completing tasks. Managing aspects of household. Attending school - [employer].  Denies SI or HI.  Denies AH or VH. Denies self harm.  Denies substance use.  Attends AA and NA meetings - occasionally.  Previous medication trials: Adderall, Zoloft , Clonazepam , Concerta, Ritalin, Prozac, Buspar, Wellbutrin, and Effexor.   Review of Systems:  Review of Systems  Musculoskeletal:  Negative for gait problem.  Neurological:  Negative for tremors.  Psychiatric/Behavioral:         Please refer to HPI    Medications: I have reviewed the patient's current medications.  Current Outpatient Medications  Medication Sig Dispense Refill   amphetamine -dextroamphetamine  (ADDERALL XR) 30 MG 24 hr capsule Take 1 capsule (30 mg total) by mouth in the morning. 17 capsule 0   amphetamine -dextroamphetamine  (ADDERALL) 10 MG tablet Take 1 tablet (10 mg total) by mouth daily at 2 PM. 17 tablet 0   Multiple Vitamin (MULTIVITAMIN) capsule Take 1 capsule by mouth daily.     No current facility-administered medications for this visit.    Medication Side Effects: None  Allergies: Allergies[1]  Past Medical History:  Diagnosis Date   ACL injury tear    left   ADHD (attention deficit hyperactivity disorder)     Family History  Problem Relation Age of Onset   ADD / ADHD Mother    Hypertension Father    Diabetes Father    Diabetes Paternal Grandfather    Suicidality Cousin    Bipolar disorder Cousin     Social History   Socioeconomic History   Marital status: Single    Spouse name: Not on file   Number of children: Not on file   Years of  education: Not on file   Highest education level: Not on file  Occupational History   Not on file  Tobacco Use   Smoking status: Some Days    Types: Cigarettes   Smokeless tobacco: Never  Vaping Use   Vaping status: Every Day  Substance and Sexual Activity   Alcohol use: Yes    Comment: fifth/day   Drug use: Not Currently    Comment: last  IVD use 12/2020   Sexual activity: Not on file  Other Topics Concern   Not on file  Social History Narrative   Not on file   Social Drivers of Health   Tobacco Use: High Risk (02/17/2024)   Patient History    Smoking Tobacco Use: Some Days    Smokeless Tobacco Use: Never    Passive Exposure: Not on file  Financial Resource Strain: Not on file  Food Insecurity: Low Risk (10/24/2022)   Received from [facility]   Epic    Within the past 12 months, you worried that your food would run out before you got money to buy more: Never true    Within the past 12 months, the food you bought just didn't last and you didn't have money to get more. : Never true  Transportation Needs: No Transportation Needs (10/24/2022)   Received from [facility]    In the past 12 months, has lack of reliable transportation kept you from medical appointments, meetings, work or from getting things needed for daily living? : No  Physical Activity: Not on file  Stress: Not on file  Social Connections: Not on file  Intimate Partner Violence: Not on file  Depression (EYV7-0): Not on file  Alcohol Screen: Not on file  Housing: Low Risk (10/24/2022)   Received from [facility]   Epic    What is your living situation today?: I have a steady place to live    Think about the place you live. Do you have problems with any of the following? Choose all that apply:: None/None on this list  Utilities: Low Risk (10/24/2022)   Received from [facility]   Utilities    In the past 12 months has the electric, gas, oil, or water company threatened to shut off services in your home? : No  Health Literacy: Not on file    Past Medical History, Surgical history, Social history, and Family history were reviewed and updated as appropriate.   Please see review of systems for further details on the patient's review from today.   Objective:   Physical Exam:  There were no vitals taken for this  visit.  Physical Exam Constitutional:      General: He is not in acute distress. Musculoskeletal:        General: No deformity.  Neurological:     Mental Status: He is alert and oriented to person, place, and time.     Coordination: Coordination normal.  Psychiatric:        Attention and Perception: Attention and perception normal. He does not perceive auditory or visual hallucinations.        Mood and Affect: Mood normal. Mood is not anxious or depressed. Affect is not labile, blunt, angry or inappropriate.        Speech: Speech normal.        Behavior: Behavior normal.        Thought Content: Thought content normal. Thought content is not paranoid or delusional. Thought content does not include homicidal or  suicidal ideation. Thought content does not include homicidal or suicidal plan.        Cognition and Memory: Cognition and memory normal.        Judgment: Judgment normal.     Comments: Insight intact     Lab Review:     Component Value Date/Time   NA 136 10/18/2022 1732   K 3.9 10/18/2022 1732   CL 103 10/18/2022 1732   CO2 16 (L) 10/18/2022 1732   GLUCOSE 93 10/18/2022 1732   BUN 7 10/18/2022 1732   CREATININE 0.72 10/18/2022 1732   CALCIUM 9.7 10/18/2022 1732   PROT 7.7 10/18/2022 1732   ALBUMIN 4.8 10/18/2022 1732   AST 857 (H) 10/18/2022 1732   ALT 755 (H) 10/18/2022 1732   ALKPHOS 134 (H) 10/18/2022 1732   BILITOT 1.2 10/18/2022 1732   GFRNONAA >60 10/18/2022 1732   GFRAA >60 10/24/2018 0017       Component Value Date/Time   WBC 4.2 10/18/2022 1732   RBC 4.98 10/18/2022 1732   HGB 15.5 10/18/2022 1732   HCT 43.9 10/18/2022 1732   PLT 172 10/18/2022 1732   MCV 88.2 10/18/2022 1732   MCH 31.1 10/18/2022 1732   MCHC 35.3 10/18/2022 1732   RDW 14.0 10/18/2022 1732   LYMPHSABS 1.4 10/24/2018 0017   MONOABS 0.5 10/24/2018 0017   EOSABS 0.1 10/24/2018 0017   BASOSABS 0.0 10/24/2018 0017    No results found for: POCLITH, LITHIUM   No results found  for: PHENYTOIN, PHENOBARB, VALPROATE, CBMZ   .res Assessment: Plan:    Plan:  Adderall 10mg  in the afternoons for class work. Adderall XR 30mg  daily.  D/C Zoloft  50mg  daily  RTC 4 weeks  10 minutes spent dedicated to the care of this patient on the date of this encounter to include pre-visit review of records, ordering of medication, post visit documentation, and face-to-face time with the patient discussing ADHD, MDD and GAD. Discussed continuing current medication regimen.  Monitor BP between visits while taking stimulant medication.   Discussed potential benefits, risks, and side effects of stimulants with patient to include increased heart rate, palpitations, insomnia, increased anxiety, increased irritability, or decreased appetite.  Instructed patient to contact office if experiencing any significant tolerability issues.   There are no diagnoses linked to this encounter.   Please see After Visit Summary for patient specific instructions.  Future Appointments  Date Time Provider Department Center  04/25/2024  5:30 PM Kollins Fenter Nattalie, NP [facility] None    No orders of the defined types were placed in this encounter.     -------------------------------     [1] No Known Allergies

## 2024-05-02 DIAGNOSIS — F1010 Alcohol abuse, uncomplicated: Secondary | ICD-10-CM

## 2024-05-02 DIAGNOSIS — K760 Fatty (change of) liver, not elsewhere classified: Secondary | ICD-10-CM

## 2024-05-02 DIAGNOSIS — R7989 Other specified abnormal findings of blood chemistry: Secondary | ICD-10-CM

## 2024-05-12 ENCOUNTER — Other Ambulatory Visit

## 2024-05-24 ENCOUNTER — Inpatient Hospital Stay: Admission: RE | Admit: 2024-05-24 | Source: Ambulatory Visit
